# Patient Record
Sex: Male | Born: 1945 | Race: Black or African American | Hispanic: No
Health system: Southern US, Community
[De-identification: ages and names within clinical notes are randomized; demographics above are authoritative.]

## PROBLEM LIST (undated history)

## (undated) DIAGNOSIS — C801 Malignant (primary) neoplasm, unspecified: Secondary | ICD-10-CM

## (undated) DIAGNOSIS — D491 Neoplasm of unspecified behavior of respiratory system: Secondary | ICD-10-CM

---

## 2003-02-20 ENCOUNTER — Emergency Department (HOSPITAL_COMMUNITY): Admission: EM | Admit: 2003-02-20 | Discharge: 2003-02-20 | Payer: Self-pay | Admitting: Emergency Medicine

## 2003-02-23 ENCOUNTER — Emergency Department (HOSPITAL_COMMUNITY): Admission: EM | Admit: 2003-02-23 | Discharge: 2003-02-23 | Payer: Self-pay | Admitting: Emergency Medicine

## 2010-01-12 ENCOUNTER — Inpatient Hospital Stay (HOSPITAL_COMMUNITY)
Admission: EM | Admit: 2010-01-12 | Discharge: 2010-01-15 | Payer: Self-pay | Source: Home / Self Care | Attending: Internal Medicine | Admitting: Internal Medicine

## 2010-01-13 ENCOUNTER — Encounter (INDEPENDENT_AMBULATORY_CARE_PROVIDER_SITE_OTHER): Payer: Self-pay | Admitting: Internal Medicine

## 2010-04-04 LAB — URINALYSIS, ROUTINE W REFLEX MICROSCOPIC
Bilirubin Urine: NEGATIVE
Bilirubin Urine: NEGATIVE
Ketones, ur: NEGATIVE mg/dL
Ketones, ur: NEGATIVE mg/dL
Nitrite: NEGATIVE
Nitrite: NEGATIVE
Specific Gravity, Urine: 1.013 (ref 1.005–1.030)
Urobilinogen, UA: 1 mg/dL (ref 0.0–1.0)
Urobilinogen, UA: 1 mg/dL (ref 0.0–1.0)
pH: 6.5 (ref 5.0–8.0)

## 2010-04-04 LAB — URINE CULTURE
Colony Count: 35000
Culture  Setup Time: 201112211430
Culture  Setup Time: 201112232024
Culture: NO GROWTH

## 2010-04-04 LAB — DIFFERENTIAL
Basophils Absolute: 0 10*3/uL (ref 0.0–0.1)
Basophils Relative: 0 % (ref 0–1)
Basophils Relative: 0 % (ref 0–1)
Eosinophils Absolute: 0 10*3/uL (ref 0.0–0.7)
Eosinophils Absolute: 0.1 10*3/uL (ref 0.0–0.7)
Eosinophils Relative: 0 % (ref 0–5)
Lymphs Abs: 1.5 10*3/uL (ref 0.7–4.0)
Monocytes Absolute: 1.2 10*3/uL — ABNORMAL HIGH (ref 0.1–1.0)
Monocytes Absolute: 1.2 10*3/uL — ABNORMAL HIGH (ref 0.1–1.0)
Monocytes Relative: 11 % (ref 3–12)

## 2010-04-04 LAB — COMPREHENSIVE METABOLIC PANEL
ALT: 34 U/L (ref 0–53)
ALT: 60 U/L — ABNORMAL HIGH (ref 0–53)
AST: 39 U/L — ABNORMAL HIGH (ref 0–37)
Albumin: 2.4 g/dL — ABNORMAL LOW (ref 3.5–5.2)
Albumin: 2.4 g/dL — ABNORMAL LOW (ref 3.5–5.2)
Alkaline Phosphatase: 56 U/L (ref 39–117)
Alkaline Phosphatase: 65 U/L (ref 39–117)
BUN: 13 mg/dL (ref 6–23)
BUN: 19 mg/dL (ref 6–23)
CO2: 26 mEq/L (ref 19–32)
Chloride: 106 mEq/L (ref 96–112)
Chloride: 107 mEq/L (ref 96–112)
Creatinine, Ser: 1.19 mg/dL (ref 0.4–1.5)
Glucose, Bld: 100 mg/dL — ABNORMAL HIGH (ref 70–99)
Potassium: 4.1 mEq/L (ref 3.5–5.1)
Sodium: 137 mEq/L (ref 135–145)
Total Bilirubin: 0.6 mg/dL (ref 0.3–1.2)
Total Protein: 6.1 g/dL (ref 6.0–8.3)

## 2010-04-04 LAB — CBC
HCT: 31.9 % — ABNORMAL LOW (ref 39.0–52.0)
Hemoglobin: 10.6 g/dL — ABNORMAL LOW (ref 13.0–17.0)
MCH: 31.5 pg (ref 26.0–34.0)
MCHC: 33.1 g/dL (ref 30.0–36.0)
MCV: 94.3 fL (ref 78.0–100.0)
MCV: 94.4 fL (ref 78.0–100.0)
MCV: 94.7 fL (ref 78.0–100.0)
Platelets: 143 10*3/uL — ABNORMAL LOW (ref 150–400)
Platelets: 146 10*3/uL — ABNORMAL LOW (ref 150–400)
RBC: 3.37 MIL/uL — ABNORMAL LOW (ref 4.22–5.81)
RBC: 3.57 MIL/uL — ABNORMAL LOW (ref 4.22–5.81)
RDW: 14.1 % (ref 11.5–15.5)
WBC: 10.1 10*3/uL (ref 4.0–10.5)
WBC: 14 10*3/uL — ABNORMAL HIGH (ref 4.0–10.5)

## 2010-04-04 LAB — BASIC METABOLIC PANEL
BUN: 21 mg/dL (ref 6–23)
Calcium: 8 mg/dL — ABNORMAL LOW (ref 8.4–10.5)
GFR calc non Af Amer: 53 mL/min — ABNORMAL LOW (ref >60)
Potassium: 3.7 mEq/L (ref 3.5–5.1)
Sodium: 139 mEq/L (ref 135–145)

## 2010-04-04 LAB — CULTURE, BLOOD (ROUTINE X 2)
Culture  Setup Time: 201112212211
Culture: NO GROWTH
Culture: NO GROWTH

## 2010-04-04 LAB — RAPID URINE DRUG SCREEN, HOSP PERFORMED
Amphetamines: NOT DETECTED
Barbiturates: NOT DETECTED

## 2010-04-04 LAB — URINE MICROSCOPIC-ADD ON

## 2010-04-04 LAB — CARDIAC PANEL(CRET KIN+CKTOT+MB+TROPI)
CK, MB: 0.7 ng/mL (ref 0.3–4.0)
Relative Index: INVALID (ref 0.0–2.5)
Total CK: 82 U/L (ref 7–232)
Troponin I: 0.02 ng/mL (ref 0.00–0.06)

## 2010-04-04 LAB — MRSA PCR SCREENING: MRSA by PCR: NEGATIVE

## 2010-04-04 LAB — PROCALCITONIN: Procalcitonin: 20.76 ng/mL

## 2018-08-03 ENCOUNTER — Inpatient Hospital Stay (HOSPITAL_COMMUNITY)
Admission: EM | Admit: 2018-08-03 | Discharge: 2018-08-07 | DRG: 640 | Disposition: A | Discharge status: Hospice | Payer: Medicare Other | Source: Skilled Nursing Facility | Attending: Internal Medicine | Admitting: Internal Medicine

## 2018-08-03 ENCOUNTER — Inpatient Hospital Stay (HOSPITAL_COMMUNITY): Payer: Medicare Other

## 2018-08-03 ENCOUNTER — Emergency Department (HOSPITAL_COMMUNITY): Payer: Medicare Other

## 2018-08-03 ENCOUNTER — Encounter (HOSPITAL_COMMUNITY): Payer: Self-pay | Admitting: Emergency Medicine

## 2018-08-03 ENCOUNTER — Other Ambulatory Visit: Payer: Self-pay

## 2018-08-03 DIAGNOSIS — Z1159 Encounter for screening for other viral diseases: Secondary | ICD-10-CM | POA: Diagnosis not present

## 2018-08-03 DIAGNOSIS — Z515 Encounter for palliative care: Secondary | ICD-10-CM | POA: Diagnosis not present

## 2018-08-03 DIAGNOSIS — Z79899 Other long term (current) drug therapy: Secondary | ICD-10-CM

## 2018-08-03 DIAGNOSIS — E870 Hyperosmolality and hypernatremia: Secondary | ICD-10-CM | POA: Diagnosis present

## 2018-08-03 DIAGNOSIS — C7931 Secondary malignant neoplasm of brain: Secondary | ICD-10-CM | POA: Diagnosis present

## 2018-08-03 DIAGNOSIS — Z7189 Other specified counseling: Secondary | ICD-10-CM

## 2018-08-03 DIAGNOSIS — G9341 Metabolic encephalopathy: Secondary | ICD-10-CM | POA: Diagnosis present

## 2018-08-03 DIAGNOSIS — Z7951 Long term (current) use of inhaled steroids: Secondary | ICD-10-CM

## 2018-08-03 DIAGNOSIS — E46 Unspecified protein-calorie malnutrition: Secondary | ICD-10-CM | POA: Diagnosis present

## 2018-08-03 DIAGNOSIS — G9340 Encephalopathy, unspecified: Secondary | ICD-10-CM | POA: Diagnosis not present

## 2018-08-03 DIAGNOSIS — E876 Hypokalemia: Secondary | ICD-10-CM | POA: Diagnosis present

## 2018-08-03 DIAGNOSIS — R296 Repeated falls: Secondary | ICD-10-CM | POA: Diagnosis present

## 2018-08-03 DIAGNOSIS — Z9221 Personal history of antineoplastic chemotherapy: Secondary | ICD-10-CM

## 2018-08-03 DIAGNOSIS — D649 Anemia, unspecified: Secondary | ICD-10-CM | POA: Diagnosis not present

## 2018-08-03 DIAGNOSIS — E861 Hypovolemia: Secondary | ICD-10-CM | POA: Diagnosis present

## 2018-08-03 DIAGNOSIS — R4182 Altered mental status, unspecified: Secondary | ICD-10-CM | POA: Diagnosis not present

## 2018-08-03 DIAGNOSIS — C3490 Malignant neoplasm of unspecified part of unspecified bronchus or lung: Secondary | ICD-10-CM | POA: Diagnosis present

## 2018-08-03 DIAGNOSIS — Z66 Do not resuscitate: Secondary | ICD-10-CM | POA: Diagnosis present

## 2018-08-03 DIAGNOSIS — Z7982 Long term (current) use of aspirin: Secondary | ICD-10-CM

## 2018-08-03 DIAGNOSIS — E860 Dehydration: Secondary | ICD-10-CM

## 2018-08-03 HISTORY — DX: Neoplasm of unspecified behavior of respiratory system: D49.1

## 2018-08-03 HISTORY — DX: Malignant (primary) neoplasm, unspecified: C80.1

## 2018-08-03 LAB — RENAL FUNCTION PANEL
Albumin: 1.9 g/dL — ABNORMAL LOW (ref 3.5–5.0)
Anion gap: 8 (ref 5–15)
BUN: 31 mg/dL — ABNORMAL HIGH (ref 8–23)
CO2: 20 mmol/L — ABNORMAL LOW (ref 22–32)
Calcium: 12.2 mg/dL — ABNORMAL HIGH (ref 8.9–10.3)
Chloride: 126 mmol/L — ABNORMAL HIGH (ref 98–111)
Creatinine, Ser: 0.97 mg/dL (ref 0.61–1.24)
GFR calc Af Amer: >60 mL/min (ref >60)
GFR calc non Af Amer: >60 mL/min (ref >60)
Glucose, Bld: 97 mg/dL (ref 70–99)
Phosphorus: 1.9 mg/dL — ABNORMAL LOW (ref 2.5–4.6)
Potassium: 3.2 mmol/L — ABNORMAL LOW (ref 3.5–5.1)
Sodium: 154 mmol/L — ABNORMAL HIGH (ref 135–145)

## 2018-08-03 LAB — CBC
HCT: 32.1 % — ABNORMAL LOW (ref 39.0–52.0)
Hemoglobin: 8.8 g/dL — ABNORMAL LOW (ref 13.0–17.0)
MCH: 29.7 pg (ref 26.0–34.0)
MCHC: 27.4 g/dL — ABNORMAL LOW (ref 30.0–36.0)
MCV: 108.4 fL — ABNORMAL HIGH (ref 80.0–100.0)
Platelets: 156 10*3/uL (ref 150–400)
RBC: 2.96 MIL/uL — ABNORMAL LOW (ref 4.22–5.81)
RDW: 20.5 % — ABNORMAL HIGH (ref 11.5–15.5)
WBC: 6.7 10*3/uL (ref 4.0–10.5)
nRBC: 0 % (ref 0.0–0.2)

## 2018-08-03 LAB — CBC WITH DIFFERENTIAL/PLATELET
Abs Immature Granulocytes: 0.02 10*3/uL (ref 0.00–0.07)
Basophils Absolute: 0 10*3/uL (ref 0.0–0.1)
Basophils Relative: 0 %
Eosinophils Absolute: 0 10*3/uL (ref 0.0–0.5)
Eosinophils Relative: 0 %
HCT: 35.1 % — ABNORMAL LOW (ref 39.0–52.0)
Hemoglobin: 9.8 g/dL — ABNORMAL LOW (ref 13.0–17.0)
Immature Granulocytes: 0 %
Lymphocytes Relative: 4 %
Lymphs Abs: 0.3 10*3/uL — ABNORMAL LOW (ref 0.7–4.0)
MCH: 29.5 pg (ref 26.0–34.0)
MCHC: 27.9 g/dL — ABNORMAL LOW (ref 30.0–36.0)
MCV: 105.7 fL — ABNORMAL HIGH (ref 80.0–100.0)
Monocytes Absolute: 0.4 10*3/uL (ref 0.1–1.0)
Monocytes Relative: 6 %
Neutro Abs: 5.9 10*3/uL (ref 1.7–7.7)
Neutrophils Relative %: 90 %
Platelets: 194 10*3/uL (ref 150–400)
RBC: 3.32 MIL/uL — ABNORMAL LOW (ref 4.22–5.81)
RDW: 20.2 % — ABNORMAL HIGH (ref 11.5–15.5)
WBC: 6.6 10*3/uL (ref 4.0–10.5)
nRBC: 0 % (ref 0.0–0.2)

## 2018-08-03 LAB — COMPREHENSIVE METABOLIC PANEL
ALT: 19 U/L (ref 0–44)
AST: 21 U/L (ref 15–41)
Albumin: 1.9 g/dL — ABNORMAL LOW (ref 3.5–5.0)
Alkaline Phosphatase: 110 U/L (ref 38–126)
Anion gap: 9 (ref 5–15)
BUN: 32 mg/dL — ABNORMAL HIGH (ref 8–23)
CO2: 21 mmol/L — ABNORMAL LOW (ref 22–32)
Calcium: 12.6 mg/dL — ABNORMAL HIGH (ref 8.9–10.3)
Chloride: 123 mmol/L — ABNORMAL HIGH (ref 98–111)
Creatinine, Ser: 1 mg/dL (ref 0.61–1.24)
GFR calc Af Amer: >60 mL/min (ref >60)
GFR calc non Af Amer: >60 mL/min (ref >60)
Glucose, Bld: 113 mg/dL — ABNORMAL HIGH (ref 70–99)
Potassium: 3.3 mmol/L — ABNORMAL LOW (ref 3.5–5.1)
Sodium: 153 mmol/L — ABNORMAL HIGH (ref 135–145)
Total Bilirubin: 0.8 mg/dL (ref 0.3–1.2)
Total Protein: 6 g/dL — ABNORMAL LOW (ref 6.5–8.1)

## 2018-08-03 LAB — BASIC METABOLIC PANEL
Anion gap: 13 (ref 5–15)
BUN: 37 mg/dL — ABNORMAL HIGH (ref 8–23)
CO2: 22 mmol/L (ref 22–32)
Calcium: 13.4 mg/dL (ref 8.9–10.3)
Chloride: 119 mmol/L — ABNORMAL HIGH (ref 98–111)
Creatinine, Ser: 1.32 mg/dL — ABNORMAL HIGH (ref 0.61–1.24)
GFR calc Af Amer: >60 mL/min (ref >60)
GFR calc non Af Amer: 53 mL/min — ABNORMAL LOW (ref >60)
Glucose, Bld: 117 mg/dL — ABNORMAL HIGH (ref 70–99)
Potassium: 2.7 mmol/L — CL (ref 3.5–5.1)
Sodium: 154 mmol/L — ABNORMAL HIGH (ref 135–145)

## 2018-08-03 LAB — TSH: TSH: 2.312 u[IU]/mL (ref 0.350–4.500)

## 2018-08-03 LAB — SARS CORONAVIRUS 2 BY RT PCR (HOSPITAL ORDER, PERFORMED IN ~~LOC~~ HOSPITAL LAB): SARS Coronavirus 2: NEGATIVE

## 2018-08-03 LAB — LACTIC ACID, PLASMA
Lactic Acid, Venous: 3.6 mmol/L (ref 0.5–1.9)
Lactic Acid, Venous: 2.3 mmol/L (ref 0.5–1.9)

## 2018-08-03 LAB — MAGNESIUM: Magnesium: 2.1 mg/dL (ref 1.7–2.4)

## 2018-08-03 MED ORDER — SODIUM CHLORIDE 0.9 % IV BOLUS
1000.0000 mL | Freq: Once | INTRAVENOUS | Status: AC
Start: 2018-08-03 — End: 2018-08-03
  Administered 2018-08-03: 1000 mL via INTRAVENOUS

## 2018-08-03 MED ORDER — HEPARIN SODIUM (PORCINE) 5000 UNIT/ML IJ SOLN
5000.0000 [IU] | Freq: Three times a day (TID) | INTRAMUSCULAR | Status: DC
  Administered 2018-08-03 – 2018-08-06 (×8): 5000 [IU] via SUBCUTANEOUS
  Filled 2018-08-03 (×8): qty 1

## 2018-08-03 MED ORDER — ACETAMINOPHEN 650 MG RE SUPP
650.0000 mg | Freq: Four times a day (QID) | RECTAL | Status: DC | PRN
  Administered 2018-08-05 – 2018-08-06 (×2): 650 mg via RECTAL
  Filled 2018-08-03 (×2): qty 1

## 2018-08-03 MED ORDER — SODIUM CHLORIDE 0.9% FLUSH
3.0000 mL | Freq: Two times a day (BID) | INTRAVENOUS | Status: DC
  Administered 2018-08-03 – 2018-08-07 (×4): 3 mL via INTRAVENOUS

## 2018-08-03 MED ORDER — LACTATED RINGERS IV BOLUS
1000.0000 mL | Freq: Once | INTRAVENOUS | Status: AC
  Administered 2018-08-03: 1000 mL via INTRAVENOUS

## 2018-08-03 MED ORDER — ACETAMINOPHEN 325 MG PO TABS
650.0000 mg | ORAL_TABLET | Freq: Four times a day (QID) | ORAL | Status: DC | PRN
  Filled 2018-08-03: qty 2

## 2018-08-03 MED ORDER — ZOLEDRONIC ACID 4 MG/5ML IV CONC
4.0000 mg | Freq: Once | INTRAVENOUS | Status: AC
  Administered 2018-08-03: 4 mg via INTRAVENOUS
  Filled 2018-08-03: qty 5

## 2018-08-03 MED ORDER — POTASSIUM CHLORIDE 10 MEQ/100ML IV SOLN
10.0000 meq | INTRAVENOUS | Status: AC
  Administered 2018-08-03 (×4): 10 meq via INTRAVENOUS
  Filled 2018-08-03 (×4): qty 100

## 2018-08-03 MED ORDER — CALCITONIN (SALMON) 200 UNIT/ML IJ SOLN
4.0000 [IU]/kg | Freq: Two times a day (BID) | INTRAMUSCULAR | Status: DC
  Administered 2018-08-03 – 2018-08-04 (×3): 392 [IU] via INTRAMUSCULAR
  Filled 2018-08-03 (×5): qty 1.96

## 2018-08-03 MED ORDER — POTASSIUM CHLORIDE 2 MEQ/ML IV SOLN
INTRAVENOUS | Status: DC
  Administered 2018-08-03 – 2018-08-04 (×2): via INTRAVENOUS
  Filled 2018-08-03 (×2): qty 1000

## 2018-08-03 MED ORDER — LORAZEPAM 2 MG/ML IJ SOLN
INTRAMUSCULAR | Status: AC
  Administered 2018-08-03: 2 mg
  Filled 2018-08-03: qty 1

## 2018-08-03 MED ORDER — SODIUM CHLORIDE 0.9 % IV BOLUS
1000.0000 mL | Freq: Once | INTRAVENOUS | Status: AC
  Administered 2018-08-03: 1000 mL via INTRAVENOUS

## 2018-08-03 NOTE — ED Notes (Signed)
Help get patient undress on the monitor patient is resting with nurse at bedside and call bell in reach

## 2018-08-03 NOTE — ED Triage Notes (Addendum)
Per EMS- Pt here from [facility]. per staff, pt had an unwitnessed fall this morning and has been altered since then. Pt making incomprehensible speech. Pt has developmental delay at baseline, unsure his normal speech. Pt was found on the floor, C collar in place. 18 GPIV to AC. 500 bolus given. Hr 120. Bp 118/72.   *Spoke to nurse from [place]. Nurse reports pt had a fall, was found on the ground. Sat  Up and was talking. Reports later after 730am pt was found with seizure like activity with gaze to one side with rapid eye movement and twitching. Pt has been non verbal since.

## 2018-08-03 NOTE — H&P (Addendum)
Date: 08/03/2018               Patient Name:  Bryan Baxter MRN: [id number]  DOB: 03/16/1945 Age / Sex: 73 y.o., male   PCP: Francesca Oman, DO         Medical Service: Internal Medicine Teaching Service         Attending Physician: Dr. Virgel Manifold, MD    First Contact: Dr. Sheppard Coil Pager: [phone]  Second Contact: Dr. Trilby Drummer Pager: [phone]       After Hours (After 5p/  First Contact Pager: [phone]  weekends / holidays): Second Contact Pager: [phone]   Chief Complaint: Confusion/ AMS  History of Present Illness: History obtained by chart review and is severely limited by patient's altered mental status.    Bryan Baxter is a 73 year old male with a hx of small cell lung cancer, multiple falls who presents with acute change in his mental status. Reportedly, he had a seizure at the nursing home, [facility].  He allegedly had a deviated gaze and was unresponsive in addition to having generalized shaking. Although the patient currently lives at assisted living, he used to live on his own as recent as 3 months ago. At that time per notes in his chart, his speech was described as fluent and he was able to cook for himself.    Spoke with the daughter on the phone who was updated on his current condition. She confirmed that the chemotherapy he was receiving was primarily for comfort as opposed to treatment. She says he is DNR in case of code.  Of note the patient receives all of his care at [facility].  Records are available in care everywhere.  Meds:  Current Meds  Medication Sig   albuterol (VENTOLIN HFA) 108 (90 Base) MCG/ACT inhaler Inhale 1-2 puffs into the lungs every 6 (six) hours as needed for wheezing or shortness of breath.   aluminum-magnesium hydroxide 200-200 MG/5ML suspension Take 30 mLs by mouth every 4 (four) hours as needed for indigestion.   amitriptyline (ELAVIL) 10 MG tablet Take 10 mg by mouth at bedtime.   aspirin EC 81 MG tablet  Take 81 mg by mouth daily.   atorvastatin (LIPITOR) 40 MG tablet Take 40 mg by mouth daily.   benzonatate (TESSALON) 200 MG capsule Take 200 mg by mouth 3 (three) times daily as needed for cough.   bisacodyl (DULCOLAX) 5 MG EC tablet Take 10 mg by mouth daily as needed for moderate constipation.   guaiFENesin (MUCINEX) 600 MG 12 hr tablet Take 600 mg by mouth 2 (two) times daily.   megestrol (MEGACE) 400 MG/10ML suspension Take 400 mg by mouth daily.   metoprolol succinate (TOPROL-XL) 50 MG 24 hr tablet Take 50 mg by mouth daily. Take with or immediately following a meal.   mirtazapine (REMERON) 15 MG tablet Take 7.5 mg by mouth at bedtime.   oxybutynin (DITROPAN-XL) 5 MG 24 hr tablet Take 5 mg by mouth at bedtime.   tamsulosin (FLOMAX) 0.4 MG CAPS capsule Take 0.4 mg by mouth daily.    Allergies: Allergies as of 08/03/2018   (No Known Allergies)   Past Medical History:  Diagnosis Date   Neoplasm of lung    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HCC)    Family History:  None on file  Social History:  Not on file  Review of Systems: A complete ROS was negative except as per HPI.   Physical Exam: Blood pressure 125/83,  pulse (!) 131, temperature (!) 97.2 F (36.2 C), temperature source Rectal, resp. rate 18, weight 97.8 kg, SpO2 94 %.  Physical Exam Constitutional:      Appearance: He is ill-appearing and toxic-appearing.     Comments: Not alert, cachectic, malnourished appearing  HENT:     Head: Normocephalic and atraumatic.     Comments: Temporal wasting      Mouth/Throat:     Mouth: Mucous membranes are dry.     Comments: Thick saliva Eyes:     General: No scleral icterus.       Right eye: No discharge.        Left eye: No discharge.     Pupils: Pupils are equal, round, and reactive to light.     Comments: Pinpoint pupils bilaterally  Difficult to appreciate pupillary response due to pinpoint size, but pupils of PERRL  Cardiovascular:     Rate and Rhythm: Tachycardia present.      Pulses: Normal pulses.     Heart sounds: Normal heart sounds. No murmur. No friction rub. No gallop.   Pulmonary:     Effort: Pulmonary effort is normal. No respiratory distress.     Breath sounds: Normal breath sounds. No wheezing.    EKG: personally reviewed my interpretation is tachycardia.   CXR: personally reviewed my interpretation is unremarkable. No consolidations or acute processes appreciated. IMPRESSION: Mild scarring right mid lung. No edema or consolidation. Stable cardiac silhouette. Stable central catheter positioning.  CT Cervical Spine w/o Contrast: IMPRESSION: 1. No acute intracranial abnormality. 2. Moderate brain parenchymal atrophy, chronic microvascular disease. 3. 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 4. Lytic lesions within the skull overlying the left frontal lobe, and left cerebellum, concerning for osseous metastatic disease or primary bone malignancy. 5. No evidence of acute traumatic injury to the cervical spine. 6. Lytic lesions within C4, C2 and C1 vertebral bodies, concerning for osseous metastatic disease. No evidence of pathologic fractures. 7. Multilevel osteoarthritic changes of the cervical spine. 8. Nodular pleural/subpleural thickening in the right apex, which may represent pleural-parenchymal scarring, however a pulmonary nodule cannot be excluded. 9. Calcific atherosclerotic disease of the carotid arteries.  CT Head w/o Contrast IMPRESSION: 1. No acute intracranial abnormality. 2. Moderate brain parenchymal atrophy, chronic microvascular disease. 3. 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 4. Lytic lesions within the skull overlying the left frontal lobe, and left cerebellum, concerning for osseous metastatic disease or primary bone malignancy. 5. No evidence of acute traumatic injury to the cervical spine. 6. Lytic lesions within C4, C2 and C1 vertebral bodies, concerning for osseous metastatic disease.  No evidence of pathologic fractures. 7. Multilevel osteoarthritic changes of the cervical spine. 8. Nodular pleural/subpleural thickening in the right apex, which may represent pleural-parenchymal scarring, however a pulmonary nodule cannot be excluded. 9. Calcific atherosclerotic disease of the carotid arteries.  Assessment & Plan by Problem: Principal Problem:   Hypercalcemia Active Problems:   Acute encephalopathy  In summary Mr. Hanners is a 74 year old male with a hx of small cell lung cancer with diffuse lytic lesions, multiple recent falls who presents with acute change in his mental status.  Per chart review, the patient was cooking for himself and mobile with a cane a couple months ago.  This is a large an acute change from his baseline.  #Altered Mental Status #Hypernatremia #Hypokalemia #Hypercalcemia #Seizure? #FEN/GI: Reportedly had a possible seizure at [facility] place, however we are unable to confirm this.  He was described to have a  deviated gaze, was unresponsive and having generalized shaking.  He had no seizure-like activity in the ED, however he was agitated and resisting staff.  He received 2mg  Ativan for agitation.  On our exam he was unresponsive, but withdrew to painful stimuli in all 4 extremities.  Despite our inability to confirm seizure-like activity, it is highly suspicious given severe electrolyte imbalances. It certainly plausible that he had a seizure because of this. He is also hypovolemic as evidenced by dry appearance on physical exam and absent urine output on cath attempt per nursing.- We will plan to correct his electrolyte abnormalities with IV fluids.  Received 2 L of normal saline and LR bolus x1 in the ED will continue to give fluids as necessary.   - Calcitonin injection twice daily, Zomet 4mg  IV - IV KCl - NPO - EEG to evaluate for ongoing seizures - Holding home oxybutynin, tamsulosin, albuterol, atorvastatin, amitriptyline, baby aspirin Megace,  metoprolol, sucralfate, Mag-Ox  #Small cell lung cancer: Likely the cause of his electrolyte imbalances.  Lytic lesions are present diffusely in his skull and spine as evidenced on the CT of the spine and head. Currently on palliative radiotherapy per [facility] hematology. - Will ensure f/u upon discharge.  Current Meds  Medication Sig   albuterol (VENTOLIN HFA) 108 (90 Base) MCG/ACT inhaler Inhale 1-2 puffs into the lungs every 6 (six) hours as needed for wheezing or shortness of breath.   aluminum-magnesium hydroxide 200-200 MG/5ML suspension Take 30 mLs by mouth every 4 (four) hours as needed for indigestion.   amitriptyline (ELAVIL) 10 MG tablet Take 10 mg by mouth at bedtime.   aspirin EC 81 MG tablet Take 81 mg by mouth daily.   atorvastatin (LIPITOR) 40 MG tablet Take 40 mg by mouth daily.   benzonatate (TESSALON) 200 MG capsule Take 200 mg by mouth 3 (three) times daily as needed for cough.   bisacodyl (DULCOLAX) 5 MG EC tablet Take 10 mg by mouth daily as needed for moderate constipation.   guaiFENesin (MUCINEX) 600 MG 12 hr tablet Take 600 mg by mouth 2 (two) times daily.   megestrol (MEGACE) 400 MG/10ML suspension Take 400 mg by mouth daily.   metoprolol succinate (TOPROL-XL) 50 MG 24 hr tablet Take 50 mg by mouth daily. Take with or immediately following a meal.   mirtazapine (REMERON) 15 MG tablet Take 7.5 mg by mouth at bedtime.   oxybutynin (DITROPAN-XL) 5 MG 24 hr tablet Take 5 mg by mouth at bedtime.   tamsulosin (FLOMAX) 0.4 MG CAPS capsule Take 0.4 mg by mouth daily.    Dispo: Admit patient to Observation with expected length of stay less than 2 midnights.  Signed: Earlene Plater, MD Internal Medicine, PGY1 Pager: [phone]  08/03/2018,2:09 PM

## 2018-08-03 NOTE — Progress Notes (Signed)
Spoke with patient's daughter Carlyon Shadow about patient status and code status. She was informed that patient is currently unresponsive and we are concerned that this is due to his electrolyte abnormalities, namely very high calcium.   We discussed code status and she states she had not discussed this with her father. We talked about what would be involved in the case of an emergency: CPR, Electric shock, medications, and likely intubation. She stated she would discuss this with her sister.   I spoke with Mrs Reather Laurence again who stated she had spoken with her sister and they had agreed to make their father DNR. Given his current health and history of lung cancer undergoing palliative chemo therapy.  We discussed current visitation limitations due to COVID-19, but discussed a possible exception due to his current status. She stated that neither her nor her sister lives in the area, but the patient's sister Mervyn Gay) does. Will work to get her an exception when patient gets to the floor  Contact information is below:  Celesta Gentile [phone] [facility]) Daughter Lowanda Foster [phone] [facility]) Daughter Mervyn Gay [phone] Sister, Will need clearance to visit  Pearson Grippe, DO IM PGY-3

## 2018-08-03 NOTE — Progress Notes (Signed)
Bryan Baxter [id number] Admission Data: 08/03/2018 6:45 PM Attending Provider: Oda Kilts, MD  EPP:IRJJOA, [name], DO Consults/ Treatment Team:   Bryan Baxter is a 73 y.o. male patient admitted from ED awake, alert  & orientated  X 3,  DNR, VSS - Blood pressure (!) 158/105, pulse (!) 124, temperature (!) 97.1 F (36.2 C), temperature source Axillary, resp. rate 14, weight 97.8 kg, SpO2 98 %.,no c/o shortness of breath, no c/o chest pain, no distress noted. Tele # [id number] placed and pt is currently running:sinus tachycardia.   IV site WDL:  antecubital right, condition patent and no redness and left, condition patent and no redness with a transparent dsg that's clean dry and intact.  Allergies:  No Known Allergies   Past Medical History:  Diagnosis Date  . Neoplasm of lung   . 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[place])     History:  obtained from chart review. Tobacco/alcohol: unknown tobacco use history unreliable  Pt orientation to unit, room and routine. Information packet given to patient/family and safety video watched.  Admission INP armband ID verified with patient/family, and in place. SR up x 2, fall risk assessment complete with Patient and family verbalizing understanding of risks associated with falls. Pt verbalizes an understanding of how to use the call bell and to call for help before getting out of bed.  Skin, clean-dry- intact without evidence of bruising, or skin tears.   No evidence of skin break down noted on exam. Skin dry without any ecchymosis and skin tears and no pressure injuries noted. Suction set up at bedside, patient placed on low bed. Sister will be allowed to visit as patient will be followed by palliative care. Will cont to monitor and assist as needed. Aspiration Precautions  Order Aspiration precautions in EMR Recommend SLP consult  Elevate HOB 30 degrees or higher Sit upright 90 degrees for meals/snacks  Maintain suction set up in room Perform  oral care 4 times a day Inspect oral cavity for retained food or medications  Supervise or assist with meals as necessary based on assessment        Independent        Set up assist        Full supervision (feeder)     Jocelyn Lamer L. Tamala Julian, MSN, RN, [place], [place]  08/03/2018 6:45 PM

## 2018-08-03 NOTE — ED Notes (Signed)
Attempted report 

## 2018-08-03 NOTE — ED Notes (Signed)
In and out cath performed, no urine return noted. Md informed.

## 2018-08-03 NOTE — ED Notes (Signed)
Update given to emergency contact [place]. Attempted to update daughters darlene and cynthia but sent straight to voicemail.

## 2018-08-03 NOTE — ED Notes (Signed)
ED TO INPATIENT HANDOFF REPORT  ED Nurse Name and Phone #:   S Name/Age/Gender Bryan Baxter 73 y.o. male Room/Bed: TRACC/TRACC  Code Status   Code Status: Full Code  Home/SNF/Other Nursing Home  Triage Complete: Triage complete  Chief Complaint ALTERED  Triage Note Per EMS- Pt here from [facility] [place] [facility]. per staff, pt had an unwitnessed fall this morning and has been altered since then. Pt making incomprehensible speech. Pt has developmental delay at baseline, unsure his normal speech. Pt was found on the floor, C collar in place. 18 GPIV to AC. 500 bolus given. Hr 120. Bp 118/72.    Allergies No Known Allergies  Level of Care/Admitting Diagnosis ED Disposition    ED Disposition Condition [facility]: [place] [[id number]]  Level of Care: Progressive [102]  Covid Evaluation: Asymptomatic Screening Protocol (No Symptoms)  Diagnosis: Hypercalcemia [275.42.ICD-9-CM]  Admitting Physician: Oda Kilts [[id number]]  Attending Physician: Oda Kilts [[id number]]  Estimated length of stay: past midnight tomorrow  Certification:: I certify this patient will need inpatient services for at least 2 midnights  PT Class (Do Not Modify): Inpatient [101]  PT Acc Code (Do Not Modify): Private [1]       B Medical/Surgery History Past Medical History:  Diagnosis Date  . Neoplasm of lung   . 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[place])       A IV Location/Drains/Wounds Patient Lines/Drains/Airways Status   Active Line/Drains/Airways    Name:   Placement date:   Placement time:   Site:   Days:   Peripheral IV 08/03/18 Left Antecubital   08/03/18    0938    Antecubital   less than 1   Peripheral IV 08/03/18 Right Antecubital   08/03/18    0939    Antecubital   less than 1   External Urinary Catheter   08/03/18    -    -   less than 1          Intake/Output Last 24 hours  Intake/Output Summary (Last 24 hours) at 08/03/2018 1526 Last  data filed at 08/03/2018 1438 Gross per 24 hour  Intake 2100 ml  Output -  Net 2100 ml    Labs/Imaging Results for orders placed or performed during the hospital encounter of 08/03/18 (from the past 48 hour(s))  CBC with Differential     Status: Abnormal   Collection Time: 08/03/18  9:25 AM  Result Value Ref Range   WBC 6.6 4.0 - 10.5 K/uL   RBC 3.32 (L) 4.22 - 5.81 MIL/uL   Hemoglobin 9.8 (L) 13.0 - 17.0 g/dL   HCT 35.1 (L) 39.0 - 52.0 %   MCV 105.7 (H) 80.0 - 100.0 fL   MCH 29.5 26.0 - 34.0 pg   MCHC 27.9 (L) 30.0 - 36.0 g/dL   RDW 20.2 (H) 11.5 - 15.5 %   Platelets 194 150 - 400 K/uL   nRBC 0.0 0.0 - 0.2 %   Neutrophils Relative % 90 %   Neutro Abs 5.9 1.7 - 7.7 K/uL   Lymphocytes Relative 4 %   Lymphs Abs 0.3 (L) 0.7 - 4.0 K/uL   Monocytes Relative 6 %   Monocytes Absolute 0.4 0.1 - 1.0 K/uL   Eosinophils Relative 0 %   Eosinophils Absolute 0.0 0.0 - 0.5 K/uL   Basophils Relative 0 %   Basophils Absolute 0.0 0.0 - 0.1 K/uL   RBC Morphology MORPHOLOGY UNREMARKABLE    Immature Granulocytes  0 %   Abs Immature Granulocytes 0.02 0.00 - 0.07 K/uL    Comment: Performed at [facility], [place] [address], [place], [place] [address]  Basic metabolic panel     Status: Abnormal   Collection Time: 08/03/18  9:25 AM  Result Value Ref Range   Sodium 154 (H) 135 - 145 mmol/L   Potassium 2.7 (LL) 3.5 - 5.1 mmol/L    Comment: CRITICAL RESULT CALLED TO, READ BACK BY AND VERIFIED WITH: A.Lonita Debes,RN @ 1017 08/03/2018 WEBBERJ    Chloride 119 (H) 98 - 111 mmol/L   CO2 22 22 - 32 mmol/L   Glucose, Bld 117 (H) 70 - 99 mg/dL   BUN 37 (H) 8 - 23 mg/dL   Creatinine, Ser 1.32 (H) 0.61 - 1.24 mg/dL   Calcium 13.4 (HH) 8.9 - 10.3 mg/dL    Comment: CRITICAL RESULT CALLED TO, READ BACK BY AND VERIFIED WITH: A.Sreeja Spies,RN @ 1017 08/03/2018 WEBBERJ    GFR calc non Af Amer 53 (L) >60 mL/min   GFR calc Af Amer >60 >60 mL/min   Anion gap 13 5 - 15    Comment: Performed at [facility] [address], [place], [place] [address]  Lactic acid, plasma     Status: Abnormal   Collection Time: 08/03/18  9:25 AM  Result Value Ref Range   Lactic Acid, Venous 3.6 (HH) 0.5 - 1.9 mmol/L    Comment: CRITICAL RESULT CALLED TO, READ BACK BY AND VERIFIED WITH: A.Takenya Travaglini,RN @ 1009 08/03/2018 [place] Performed at [facility], [place] [address] [place], [place] [address]   Lactic acid, plasma     Status: Abnormal   Collection Time: 08/03/18 11:07 AM  Result Value Ref Range   Lactic Acid, Venous 2.3 (HH) 0.5 - 1.9 mmol/L    Comment: CRITICAL RESULT CALLED TO, READ BACK BY AND VERIFIED WITH: A.Kiari Hosmer,RN @ 0737 08/03/2018 [place] Performed at [facility], [place] [address], [place], [place] [address]   SARS Coronavirus 2 (CEPHEID - Performed in [facility] lab), Hosp Order     Status: None   Collection Time: 08/03/18  1:44 PM   Specimen: Nasopharyngeal Swab  Result Value Ref Range   SARS Coronavirus 2 NEGATIVE NEGATIVE    Comment: (NOTE) If result is NEGATIVE SARS-CoV-2 target nucleic acids are NOT DETECTED. The SARS-CoV-2 RNA is generally detectable in upper and lower  respiratory specimens during the acute phase of infection. The lowest  concentration of SARS-CoV-2 viral copies this assay can detect is 250  copies / mL. A negative result does not preclude SARS-CoV-2 infection  and should not be used as the sole basis for treatment or other  patient management decisions.  A negative result may occur with  improper specimen collection / handling, submission of specimen other  than nasopharyngeal swab, presence of viral mutation(s) within the  areas targeted by this assay, and inadequate number of viral copies  (<250 copies / mL). A negative result must be combined with clinical  observations, patient history, and epidemiological information. If result is POSITIVE SARS-CoV-2 target nucleic acids are DETECTED. The SARS-CoV-2 RNA is generally detectable in upper and  lower  respiratory specimens dur ing the acute phase of infection.  Positive  results are indicative of active infection with SARS-CoV-2.  Clinical  correlation with patient history and other diagnostic information is  necessary to determine patient infection status.  Positive results do  not rule out bacterial infection or co-infection  with other viruses. If result is PRESUMPTIVE POSTIVE SARS-CoV-2 nucleic acids MAY BE PRESENT.   A presumptive positive result was obtained on the submitted specimen  and confirmed on repeat testing.  While 2019 novel coronavirus  (SARS-CoV-2) nucleic acids may be present in the submitted sample  additional confirmatory testing may be necessary for epidemiological  and / or clinical management purposes  to differentiate between  SARS-CoV-2 and other Sarbecovirus currently known to infect humans.  If clinically indicated additional testing with an alternate test  methodology [phone]) is advised. The SARS-CoV-2 RNA is generally  detectable in upper and lower respiratory sp ecimens during the acute  phase of infection. The expected result is Negative. Fact Sheet for Patients:  StrictlyIdeas.no Fact Sheet for Healthcare Providers: BankingDealers.co.za This test is not yet approved or cleared by the [place] FDA and has been authorized for detection and/or diagnosis of SARS-CoV-2 by FDA under an Emergency Use Authorization (EUA).  This EUA will remain in effect (meaning this test can be used) for the duration of the COVID-19 declaration under Section 564(b)(1) of the Act, 21 U.S.C. section 360bbb-3(b)(1), unless the authorization is terminated or revoked sooner. Performed at [facility], [place] [address] [place], [place] [address]    Ct Head Wo Contrast  Result Date: 08/03/2018 CLINICAL DATA:  Unexpected loss of consciousness. Unwitnessed fall. EXAM: CT HEAD WITHOUT CONTRAST CT CERVICAL SPINE  WITHOUT CONTRAST TECHNIQUE: Multidetector CT imaging of the head and cervical spine was performed following the standard protocol without intravenous contrast. Multiplanar CT image reconstructions of the cervical spine were also generated. COMPARISON:  Brain MRI May 17, 2018 FINDINGS: CT HEAD FINDINGS Brain: No evidence of acute infarction, hemorrhage, hydrocephalus, extra-axial collection or mass lesion/mass effect. Moderate brain parenchymal volume loss. Chronic changes of lacunar left posterior frontal lobe infarct. Moderate chronic small vessel ischemic disease. Vascular: Calcific atherosclerotic disease of intra cavernous carotid arteries. Skull: 2.5 cm lytic lesion within the skull overlying the left frontal lobe. Slight periosteal reaction or dural thickening is seen along the inner plate at the lesion. A second 1.1 cm lytic lesion within the this skull overlying the left cerebellum. Sinuses/Orbits: No acute finding. Other: None. CT CERVICAL SPINE FINDINGS Alignment: Straightening of the cervical lordosis. Skull base and vertebrae: No evidence of acute fracture. 10 mm lytic lesion within C4 vertebral body and an ill-defined lytic lesion within the left pedicle and lateral process of C4 vertebral body. No evidence of pathologic fracture. Lytic lesion within posterior process of C2 vertebral body. Suspected lytic lesion within the lateral process of C1 vertebral body Soft tissues and spinal canal: No prevertebral fluid or swelling. No visible canal hematoma. Disc levels:  Multilevel osteoarthritic changes. Upper chest: Nodular pleural/subpleural thickening in the right apex. Other: Calcific atherosclerotic disease of the carotid arteries. IMPRESSION: 1. No acute intracranial abnormality. 2. Moderate brain parenchymal atrophy, chronic microvascular disease. 3. 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 4. Lytic lesions within the skull overlying the left frontal lobe, and left cerebellum, concerning  for osseous metastatic disease or primary bone malignancy. 5. No evidence of acute traumatic injury to the cervical spine. 6. Lytic lesions within C4, C2 and C1 vertebral bodies, concerning for osseous metastatic disease. No evidence of pathologic fractures. 7. Multilevel osteoarthritic changes of the cervical spine. 8. Nodular pleural/subpleural thickening in the right apex, which may represent pleural-parenchymal scarring, however a pulmonary nodule cannot be excluded. 9. Calcific atherosclerotic disease of the carotid arteries. Electronically Signed   By: Thomas Hoff  Dimitrova M.D.   On: 08/03/2018 10:29   Ct Cervical Spine Wo Contrast  Result Date: 08/03/2018 CLINICAL DATA:  Unexpected loss of consciousness. Unwitnessed fall. EXAM: CT HEAD WITHOUT CONTRAST CT CERVICAL SPINE WITHOUT CONTRAST TECHNIQUE: Multidetector CT imaging of the head and cervical spine was performed following the standard protocol without intravenous contrast. Multiplanar CT image reconstructions of the cervical spine were also generated. COMPARISON:  Brain MRI May 17, 2018 FINDINGS: CT HEAD FINDINGS Brain: No evidence of acute infarction, hemorrhage, hydrocephalus, extra-axial collection or mass lesion/mass effect. Moderate brain parenchymal volume loss. Chronic changes of lacunar left posterior frontal lobe infarct. Moderate chronic small vessel ischemic disease. Vascular: Calcific atherosclerotic disease of intra cavernous carotid arteries. Skull: 2.5 cm lytic lesion within the skull overlying the left frontal lobe. Slight periosteal reaction or dural thickening is seen along the inner plate at the lesion. A second 1.1 cm lytic lesion within the this skull overlying the left cerebellum. Sinuses/Orbits: No acute finding. Other: None. CT CERVICAL SPINE FINDINGS Alignment: Straightening of the cervical lordosis. Skull base and vertebrae: No evidence of acute fracture. 10 mm lytic lesion within C4 vertebral body and an ill-defined lytic  lesion within the left pedicle and lateral process of C4 vertebral body. No evidence of pathologic fracture. Lytic lesion within posterior process of C2 vertebral body. Suspected lytic lesion within the lateral process of C1 vertebral body Soft tissues and spinal canal: No prevertebral fluid or swelling. No visible canal hematoma. Disc levels:  Multilevel osteoarthritic changes. Upper chest: Nodular pleural/subpleural thickening in the right apex. Other: Calcific atherosclerotic disease of the carotid arteries. IMPRESSION: 1. No acute intracranial abnormality. 2. Moderate brain parenchymal atrophy, chronic microvascular disease. 3. 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 4. Lytic lesions within the skull overlying the left frontal lobe, and left cerebellum, concerning for osseous metastatic disease or primary bone malignancy. 5. No evidence of acute traumatic injury to the cervical spine. 6. Lytic lesions within C4, C2 and C1 vertebral bodies, concerning for osseous metastatic disease. No evidence of pathologic fractures. 7. Multilevel osteoarthritic changes of the cervical spine. 8. Nodular pleural/subpleural thickening in the right apex, which may represent pleural-parenchymal scarring, however a pulmonary nodule cannot be excluded. 9. Calcific atherosclerotic disease of the carotid arteries. Electronically Signed   By: Fidela Salisbury M.D.   On: 08/03/2018 10:29   Dg Chest Portable 1 View  Result Date: 08/03/2018 CLINICAL DATA:  Altered mental status with apparent fall earlier in the day EXAM: PORTABLE CHEST 1 VIEW COMPARISON:  Jun 12, 2018 FINDINGS: There is no edema or consolidation. There is stable scarring in the right mid lung. Heart size and pulmonary vascularity are normal. No adenopathy. Port-A-Cath tip is at the cavoatrial junction. A stent is noted in the right pulmonary artery region. No pneumothorax. No bone lesions. IMPRESSION: Mild scarring right mid lung. No edema or  consolidation. Stable cardiac silhouette. Stable central catheter positioning. Electronically Signed   By: Lowella Grip III M.D.   On: 08/03/2018 11:51    Pending Labs Unresulted Labs (From admission, onward)    Start     Ordered   08/04/18 0500  Calcium  Tomorrow morning,   R     08/03/18 1515   08/04/18 0500  CBC  Tomorrow morning,   R     08/03/18 1516   08/03/18 2000  Renal function panel  Now then every 6 hours,   R (with STAT occurrences)     08/03/18 1516   08/03/18 1507  TSH  Once,   STAT     08/03/18 1516   08/03/18 1455  CBC  (heparin)  Once,   STAT    Comments: Baseline for heparin therapy IF NOT ALREADY DRAWN.  Notify MD if PLT < 100 K.    08/03/18 1516   08/03/18 1455  Creatinine, serum  (heparin)  Once,   STAT    Comments: Baseline for heparin therapy IF NOT ALREADY DRAWN.    08/03/18 1516   08/03/18 1418  Calcitriol (1,25 di-OH Vit D)  ONCE - STAT,   STAT     08/03/18 1417   08/03/18 1416  VITAMIN D 25 Hydroxy (Vit-D Deficiency, Fractures)  ONCE - STAT,   STAT     08/03/18 1416   08/03/18 1416  PTH-related peptide  ONCE - STAT,   STAT     08/03/18 1416   08/03/18 1414  PTH, intact and calcium  ONCE - STAT,   STAT     08/03/18 1414   08/03/18 1352  Comprehensive metabolic panel  ONCE - STAT,   STAT     08/03/18 1351   08/03/18 0908  Novel Coronavirus,NAA,(SEND-OUT TO REF LAB - TAT 24-48 hrs); Hosp Order  (Asymptomatic Patients Labs)  Once,   STAT    Question:  Rule Out  Answer:  Yes   08/03/18 0907   08/03/18 0907  Urinalysis, Routine w reflex microscopic  Once,   STAT     08/03/18 0907          Vitals/Pain Today's Vitals   08/03/18 1415 08/03/18 1430 08/03/18 1445 08/03/18 1500  BP: 119/82 120/79 (!) 149/99 (!) 136/93  Pulse: (!) 115 (!) 116    Resp: 12 13 16 20   Temp:      TempSrc:      SpO2: 100% 100%    Weight:        Isolation Precautions No active isolations  Medications Medications  potassium chloride 10 mEq in 100 mL IVPB (10 mEq  Intravenous New Bag/Given 08/03/18 1515)  calcitonin (MIACALCIN) injection 392 Units (has no administration in time range)  zolendronic acid (ZOMETA) 4 mg in sodium chloride 0.9 % 100 mL IVPB (has no administration in time range)  heparin injection 5,000 Units (has no administration in time range)  sodium chloride flush (NS) 0.9 % injection 3 mL (has no administration in time range)  acetaminophen (TYLENOL) tablet 650 mg (has no administration in time range)    Or  acetaminophen (TYLENOL) suppository 650 mg (has no administration in time range)  LORazepam (ATIVAN) 2 MG/ML injection (2 mg  Given 08/03/18 0940)  lactated ringers bolus 1,000 mL (0 mLs Intravenous Stopped 08/03/18 1124)  sodium chloride 0.9 % bolus 1,000 mL (0 mLs Intravenous Stopped 08/03/18 1254)  sodium chloride 0.9 % bolus 1,000 mL (1,000 mLs Intravenous New Bag/Given 08/03/18 1434)    Mobility      Focused Assessments    R Recommendations: See Admitting Provider Note  Report given to:   Additional Notes:  [id number]

## 2018-08-03 NOTE — ED Notes (Signed)
Report given to [facility] RN

## 2018-08-03 NOTE — Progress Notes (Signed)
Attempted report 

## 2018-08-03 NOTE — ED Provider Notes (Signed)
[facility] EMERGENCY DEPARTMENT Provider Note   CSN: [id number] Arrival date & time: 08/03/18  1751     History   Chief Complaint No chief complaint on file.   HPI Bryan Baxter is a 73 y.o. male.     HPI   50yM with change in mental status. Reported history somewhat conflicts with review of records. Hx of non-small cell lung CA currently undergoing tx. Notes as recently as 3 months ago mention him living at home, describe his speech as fluent and "he can cook for himself but often times does not and chooses to drink boost/Ensure shakes up to 3 times per day. This morning had 2 boost shakes for breakfast."  I can readily tell from review of records what has happened in the interim since the end of April but he is now at [facility] Reportedly had possible seizure today. Described as deviated gaze, unresponsive and generalized shaking.   On arrival pt is non verbal. Appears to be sleeping in bed. Will moan with touch and withdrawals all extremities when they are passively manipulated but doesn't clearly seem to be in pain.   No past medical history on file.  There are no active problems to display for this patient.    Home Medications    Prior to Admission medications   Not on File    Family History No family history on file.  Social History Social History   Tobacco Use   Smoking status: Not on file  Substance Use Topics   Alcohol use: Not on file   Drug use: Not on file     Allergies   Patient has no allergy information on record.   Review of Systems Review of Systems  Level 5 caveat because pt is nonverbal.  Physical Exam Updated Vital Signs There were no vitals taken for this visit.  Physical Exam Vitals signs and nursing note reviewed.  Constitutional:      General: He is not in acute distress.    Appearance: He is well-developed.     Comments: Laying in bed with eyes closed. NAD. Cachectic.   HENT:     Head:  Normocephalic and atraumatic.     Mouth/Throat:     Mouth: Mucous membranes are dry.     Comments: Dry mucus membranes Eyes:     General:        Right eye: No discharge.        Left eye: No discharge.     Conjunctiva/sclera: Conjunctivae normal.  Neck:     Musculoskeletal: Neck supple.  Cardiovascular:     Rate and Rhythm: Regular rhythm. Tachycardia present.     Heart sounds: Normal heart sounds. No murmur. No friction rub. No gallop.      Comments: Port a cath L chest.  Pulmonary:     Effort: Pulmonary effort is normal. No respiratory distress.     Breath sounds: Normal breath sounds.  Abdominal:     General: There is no distension.     Palpations: Abdomen is soft.     Tenderness: There is no abdominal tenderness.  Musculoskeletal:        General: No tenderness.  Skin:    General: Skin is warm and dry.  Neurological:     Comments: Initially laying in bed and not doing much. Later becoming agitated and pulling off monitoring equipment. When trying to reassure him he became combative. Trying to get out of bed. Had to be physically restrained so he  wouldn't fall. Resisting this and trying to bite. Good strength in all extremities. Moaning and other incomprehensible sounds.     ED Treatments / Results  Labs (all labs ordered are listed, but only abnormal results are displayed) Labs Reviewed  CBC WITH DIFFERENTIAL/PLATELET - Abnormal; Notable for the following components:      Result Value   RBC 3.32 (*)    Hemoglobin 9.8 (*)    HCT 35.1 (*)    MCV 105.7 (*)    MCHC 27.9 (*)    RDW 20.2 (*)    Lymphs Abs 0.3 (*)    All other components within normal limits  BASIC METABOLIC PANEL - Abnormal; Notable for the following components:   Sodium 154 (*)    Potassium 2.7 (*)    Chloride 119 (*)    Glucose, Bld 117 (*)    BUN 37 (*)    Creatinine, Ser 1.32 (*)    Calcium 13.4 (*)    GFR calc non Af Amer 53 (*)    All other components within normal limits  LACTIC ACID, PLASMA -  Abnormal; Notable for the following components:   Lactic Acid, Venous 3.6 (*)    All other components within normal limits  LACTIC ACID, PLASMA - Abnormal; Notable for the following components:   Lactic Acid, Venous 2.3 (*)    All other components within normal limits  COMPREHENSIVE METABOLIC PANEL - Abnormal; Notable for the following components:   Sodium 153 (*)    Potassium 3.3 (*)    Chloride 123 (*)    CO2 21 (*)    Glucose, Bld 113 (*)    BUN 32 (*)    Calcium 12.6 (*)    Total Protein 6.0 (*)    Albumin 1.9 (*)    All other components within normal limits  CBC - Abnormal; Notable for the following components:   RBC 2.96 (*)    Hemoglobin 8.8 (*)    HCT 32.1 (*)    MCV 108.4 (*)    MCHC 27.4 (*)    RDW 20.5 (*)    All other components within normal limits  RENAL FUNCTION PANEL - Abnormal; Notable for the following components:   Sodium 154 (*)    Potassium 3.2 (*)    Chloride 126 (*)    CO2 20 (*)    BUN 31 (*)    Calcium 12.2 (*)    Phosphorus 1.9 (*)    Albumin 1.9 (*)    All other components within normal limits  RENAL FUNCTION PANEL - Abnormal; Notable for the following components:   Sodium 155 (*)    Potassium 3.0 (*)    Chloride 126 (*)    CO2 20 (*)    BUN 27 (*)    Calcium 11.8 (*)    Phosphorus 1.4 (*)    Albumin 1.9 (*)    All other components within normal limits  CBC - Abnormal; Notable for the following components:   RBC 2.92 (*)    Hemoglobin 8.6 (*)    HCT 29.2 (*)    MCHC 29.5 (*)    RDW 20.1 (*)    All other components within normal limits  SARS CORONAVIRUS 2 (HOSPITAL ORDER, [place] [facility])  NOVEL CORONAVIRUS, NAA (HOSPITAL ORDER, SEND-OUT TO REF LAB)  TSH  MAGNESIUM  MAGNESIUM  URINALYSIS, ROUTINE W REFLEX MICROSCOPIC  PTH, INTACT AND CALCIUM  VITAMIN D 25 HYDROXY (VIT D DEFICIENCY, FRACTURES)  PTH-RELATED PEPTIDE  CALCITRIOL (  1,25 DI-OH VIT D)  RENAL FUNCTION PANEL  RENAL FUNCTION PANEL  RENAL FUNCTION  PANEL    EKG EKG Interpretation  Date/Time:  Saturday August 03 2018 09:03:25 EDT Ventricular Rate:  129 PR Interval:    QRS Duration: 112 QT Interval:  273 QTC Calculation: 402 R Axis:   65 Text Interpretation:  suspect sinus tachycardia. pt shaking/has tremor. less likely atrial fibrillation Borderline ST elevation, lateral leads Confirmed by Virgel Manifold [phone]) on 08/03/2018 9:52:02 AM   Radiology Ct Head Wo Contrast  Result Date: 08/03/2018 CLINICAL DATA:  Unexpected loss of consciousness. Unwitnessed fall. EXAM: CT HEAD WITHOUT CONTRAST CT CERVICAL SPINE WITHOUT CONTRAST TECHNIQUE: Multidetector CT imaging of the head and cervical spine was performed following the standard protocol without intravenous contrast. Multiplanar CT image reconstructions of the cervical spine were also generated. COMPARISON:  Brain MRI May 17, 2018 FINDINGS: CT HEAD FINDINGS Brain: No evidence of acute infarction, hemorrhage, hydrocephalus, extra-axial collection or mass lesion/mass effect. Moderate brain parenchymal volume loss. Chronic changes of lacunar left posterior frontal lobe infarct. Moderate chronic small vessel ischemic disease. Vascular: Calcific atherosclerotic disease of intra cavernous carotid arteries. Skull: 2.5 cm lytic lesion within the skull overlying the left frontal lobe. Slight periosteal reaction or dural thickening is seen along the inner plate at the lesion. A second 1.1 cm lytic lesion within the this skull overlying the left cerebellum. Sinuses/Orbits: No acute finding. Other: None. CT CERVICAL SPINE FINDINGS Alignment: Straightening of the cervical lordosis. Skull base and vertebrae: No evidence of acute fracture. 10 mm lytic lesion within C4 vertebral body and an ill-defined lytic lesion within the left pedicle and lateral process of C4 vertebral body. No evidence of pathologic fracture. Lytic lesion within posterior process of C2 vertebral body. Suspected lytic lesion within the  lateral process of C1 vertebral body Soft tissues and spinal canal: No prevertebral fluid or swelling. No visible canal hematoma. Disc levels:  Multilevel osteoarthritic changes. Upper chest: Nodular pleural/subpleural thickening in the right apex. Other: Calcific atherosclerotic disease of the carotid arteries. IMPRESSION: 1. No acute intracranial abnormality. 2. Moderate brain parenchymal atrophy, chronic microvascular disease. 3. 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 4. Lytic lesions within the skull overlying the left frontal lobe, and left cerebellum, concerning for osseous metastatic disease or primary bone malignancy. 5. No evidence of acute traumatic injury to the cervical spine. 6. Lytic lesions within C4, C2 and C1 vertebral bodies, concerning for osseous metastatic disease. No evidence of pathologic fractures. 7. Multilevel osteoarthritic changes of the cervical spine. 8. Nodular pleural/subpleural thickening in the right apex, which may represent pleural-parenchymal scarring, however a pulmonary nodule cannot be excluded. 9. Calcific atherosclerotic disease of the carotid arteries. Electronically Signed   By: Fidela Salisbury M.D.   On: 08/03/2018 10:29   Ct Cervical Spine Wo Contrast  Result Date: 08/03/2018 CLINICAL DATA:  Unexpected loss of consciousness. Unwitnessed fall. EXAM: CT HEAD WITHOUT CONTRAST CT CERVICAL SPINE WITHOUT CONTRAST TECHNIQUE: Multidetector CT imaging of the head and cervical spine was performed following the standard protocol without intravenous contrast. Multiplanar CT image reconstructions of the cervical spine were also generated. COMPARISON:  Brain MRI May 17, 2018 FINDINGS: CT HEAD FINDINGS Brain: No evidence of acute infarction, hemorrhage, hydrocephalus, extra-axial collection or mass lesion/mass effect. Moderate brain parenchymal volume loss. Chronic changes of lacunar left posterior frontal lobe infarct. Moderate chronic small vessel ischemic  disease. Vascular: Calcific atherosclerotic disease of intra cavernous carotid arteries. Skull: 2.5 cm lytic lesion within the skull  overlying the left frontal lobe. Slight periosteal reaction or dural thickening is seen along the inner plate at the lesion. A second 1.1 cm lytic lesion within the this skull overlying the left cerebellum. Sinuses/Orbits: No acute finding. Other: None. CT CERVICAL SPINE FINDINGS Alignment: Straightening of the cervical lordosis. Skull base and vertebrae: No evidence of acute fracture. 10 mm lytic lesion within C4 vertebral body and an ill-defined lytic lesion within the left pedicle and lateral process of C4 vertebral body. No evidence of pathologic fracture. Lytic lesion within posterior process of C2 vertebral body. Suspected lytic lesion within the lateral process of C1 vertebral body Soft tissues and spinal canal: No prevertebral fluid or swelling. No visible canal hematoma. Disc levels:  Multilevel osteoarthritic changes. Upper chest: Nodular pleural/subpleural thickening in the right apex. Other: Calcific atherosclerotic disease of the carotid arteries. IMPRESSION: 1. No acute intracranial abnormality. 2. Moderate brain parenchymal atrophy, chronic microvascular disease. 3. 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 4. Lytic lesions within the skull overlying the left frontal lobe, and left cerebellum, concerning for osseous metastatic disease or primary bone malignancy. 5. No evidence of acute traumatic injury to the cervical spine. 6. Lytic lesions within C4, C2 and C1 vertebral bodies, concerning for osseous metastatic disease. No evidence of pathologic fractures. 7. Multilevel osteoarthritic changes of the cervical spine. 8. Nodular pleural/subpleural thickening in the right apex, which may represent pleural-parenchymal scarring, however a pulmonary nodule cannot be excluded. 9. Calcific atherosclerotic disease of the carotid arteries. Electronically Signed   By:  Fidela Salisbury M.D.   On: 08/03/2018 10:29   Dg Chest Portable 1 View  Result Date: 08/03/2018 CLINICAL DATA:  Altered mental status with apparent fall earlier in the day EXAM: PORTABLE CHEST 1 VIEW COMPARISON:  Jun 12, 2018 FINDINGS: There is no edema or consolidation. There is stable scarring in the right mid lung. Heart size and pulmonary vascularity are normal. No adenopathy. Port-A-Cath tip is at the cavoatrial junction. A stent is noted in the right pulmonary artery region. No pneumothorax. No bone lesions. IMPRESSION: Mild scarring right mid lung. No edema or consolidation. Stable cardiac silhouette. Stable central catheter positioning. Electronically Signed   By: Lowella Grip III M.D.   On: 08/03/2018 11:51    Procedures Procedures (including critical care time)  CRITICAL CARE Performed by: Virgel Manifold Total critical care time: 35 minutes Critical care time was exclusive of separately billable procedures and treating other patients. Critical care was necessary to treat or prevent imminent or life-threatening deterioration. Critical care was time spent personally by me on the following activities: development of treatment plan with patient and/or surrogate as well as nursing, discussions with consultants, evaluation of patient's response to treatment, examination of patient, obtaining history from patient or surrogate, ordering and performing treatments and interventions, ordering and review of laboratory studies, ordering and review of radiographic studies, pulse oximetry and re-evaluation of patient's condition.   Medications Ordered in ED Medications - No data to display   Initial Impression / Assessment and Plan / ED Course  I have reviewed the triage vital signs and the nursing notes.  Pertinent labs & imaging results that were available during my care of the patient were reviewed by me and considered in my medical decision making (see chart for details).         73yM with altered mental status. From hypercalcemia? Multiple lytic lesions noted on imaging and this could be the underlying etiology. Also hypokalemia. Hypernatremia and AKI consistent with dehydration.  CT w/o acute abnormality. No urine when tried to cath. Nursing reporting that it seemed like catheter passed prostate but no urine. May just be very dry.  IVF. Replete potassium. Admit.   Final Clinical Impressions(s) / ED Diagnoses   Final diagnoses:  Altered mental status, unspecified altered mental status type  Hypercalcemia  Dehydration    ED Discharge Orders    None       Virgel Manifold, MD 08/04/18 [phone]

## 2018-08-03 NOTE — Progress Notes (Signed)
EEG complete - results pending 

## 2018-08-04 DIAGNOSIS — E46 Unspecified protein-calorie malnutrition: Secondary | ICD-10-CM | POA: Diagnosis present

## 2018-08-04 DIAGNOSIS — R4182 Altered mental status, unspecified: Secondary | ICD-10-CM

## 2018-08-04 LAB — RENAL FUNCTION PANEL
Albumin: 1.9 g/dL — ABNORMAL LOW (ref 3.5–5.0)
Albumin: 1.9 g/dL — ABNORMAL LOW (ref 3.5–5.0)
Albumin: 1.8 g/dL — ABNORMAL LOW (ref 3.5–5.0)
Albumin: 1.7 g/dL — ABNORMAL LOW (ref 3.5–5.0)
Anion gap: 9 (ref 5–15)
Anion gap: 10 (ref 5–15)
Anion gap: 8 (ref 5–15)
Anion gap: 7 (ref 5–15)
BUN: 27 mg/dL — ABNORMAL HIGH (ref 8–23)
BUN: 24 mg/dL — ABNORMAL HIGH (ref 8–23)
BUN: 20 mg/dL (ref 8–23)
BUN: 14 mg/dL (ref 8–23)
CO2: 20 mmol/L — ABNORMAL LOW (ref 22–32)
CO2: 20 mmol/L — ABNORMAL LOW (ref 22–32)
CO2: 21 mmol/L — ABNORMAL LOW (ref 22–32)
CO2: 22 mmol/L (ref 22–32)
Calcium: 11.8 mg/dL — ABNORMAL HIGH (ref 8.9–10.3)
Calcium: 11.7 mg/dL — ABNORMAL HIGH (ref 8.9–10.3)
Calcium: 11.1 mg/dL — ABNORMAL HIGH (ref 8.9–10.3)
Calcium: 10 mg/dL (ref 8.9–10.3)
Chloride: 126 mmol/L — ABNORMAL HIGH (ref 98–111)
Chloride: 129 mmol/L — ABNORMAL HIGH (ref 98–111)
Chloride: 126 mmol/L — ABNORMAL HIGH (ref 98–111)
Chloride: 122 mmol/L — ABNORMAL HIGH (ref 98–111)
Creatinine, Ser: 0.9 mg/dL (ref 0.61–1.24)
Creatinine, Ser: 0.96 mg/dL (ref 0.61–1.24)
Creatinine, Ser: 0.89 mg/dL (ref 0.61–1.24)
Creatinine, Ser: 0.9 mg/dL (ref 0.61–1.24)
GFR calc Af Amer: >60 mL/min (ref >60)
GFR calc Af Amer: >60 mL/min (ref >60)
GFR calc Af Amer: >60 mL/min (ref >60)
GFR calc Af Amer: >60 mL/min (ref >60)
GFR calc non Af Amer: >60 mL/min (ref >60)
GFR calc non Af Amer: >60 mL/min (ref >60)
GFR calc non Af Amer: >60 mL/min (ref >60)
GFR calc non Af Amer: >60 mL/min (ref >60)
Glucose, Bld: 82 mg/dL (ref 70–99)
Glucose, Bld: 70 mg/dL (ref 70–99)
Glucose, Bld: 163 mg/dL — ABNORMAL HIGH (ref 70–99)
Glucose, Bld: 193 mg/dL — ABNORMAL HIGH (ref 70–99)
Phosphorus: 1.4 mg/dL — ABNORMAL LOW (ref 2.5–4.6)
Phosphorus: 1.7 mg/dL — ABNORMAL LOW (ref 2.5–4.6)
Phosphorus: <1 mg/dL — CL (ref 2.5–4.6)
Phosphorus: 2 mg/dL — ABNORMAL LOW (ref 2.5–4.6)
Potassium: 3 mmol/L — ABNORMAL LOW (ref 3.5–5.1)
Potassium: 3.6 mmol/L (ref 3.5–5.1)
Potassium: 3.2 mmol/L — ABNORMAL LOW (ref 3.5–5.1)
Potassium: 3.2 mmol/L — ABNORMAL LOW (ref 3.5–5.1)
Sodium: 155 mmol/L — ABNORMAL HIGH (ref 135–145)
Sodium: 159 mmol/L — ABNORMAL HIGH (ref 135–145)
Sodium: 155 mmol/L — ABNORMAL HIGH (ref 135–145)
Sodium: 151 mmol/L — ABNORMAL HIGH (ref 135–145)

## 2018-08-04 LAB — CBC
HCT: 29.2 % — ABNORMAL LOW (ref 39.0–52.0)
Hemoglobin: 8.6 g/dL — ABNORMAL LOW (ref 13.0–17.0)
MCH: 29.5 pg (ref 26.0–34.0)
MCHC: 29.5 g/dL — ABNORMAL LOW (ref 30.0–36.0)
MCV: 100 fL (ref 80.0–100.0)
Platelets: 153 10*3/uL (ref 150–400)
RBC: 2.92 MIL/uL — ABNORMAL LOW (ref 4.22–5.81)
RDW: 20.1 % — ABNORMAL HIGH (ref 11.5–15.5)
WBC: 8.6 10*3/uL (ref 4.0–10.5)
nRBC: 0.2 % (ref 0.0–0.2)

## 2018-08-04 LAB — MAGNESIUM: Magnesium: 2.1 mg/dL (ref 1.7–2.4)

## 2018-08-04 LAB — URINALYSIS, ROUTINE W REFLEX MICROSCOPIC
Bilirubin Urine: NEGATIVE
Glucose, UA: 50 mg/dL — AB
Hgb urine dipstick: NEGATIVE
Ketones, ur: NEGATIVE mg/dL
Leukocytes,Ua: NEGATIVE
Nitrite: NEGATIVE
Protein, ur: NEGATIVE mg/dL
Specific Gravity, Urine: 1.009 (ref 1.005–1.030)
pH: 7 (ref 5.0–8.0)

## 2018-08-04 LAB — OSMOLALITY, URINE: Osmolality, Ur: 404 mOsm/kg (ref 300–900)

## 2018-08-04 MED ORDER — POTASSIUM PHOSPHATES 15 MMOLE/5ML IV SOLN
30.0000 mmol | Freq: Once | INTRAVENOUS | Status: AC
  Administered 2018-08-04: 30 mmol via INTRAVENOUS
  Filled 2018-08-04: qty 10

## 2018-08-04 MED ORDER — POTASSIUM CHLORIDE 10 MEQ/100ML IV SOLN
10.0000 meq | INTRAVENOUS | Status: AC
  Administered 2018-08-04 (×4): 10 meq via INTRAVENOUS
  Filled 2018-08-04 (×4): qty 100

## 2018-08-04 MED ORDER — DEXTROSE-NACL 5-0.45 % IV SOLN
INTRAVENOUS | Status: DC
  Administered 2018-08-04 (×3): via INTRAVENOUS

## 2018-08-04 MED ORDER — DEXTROSE 5 % IV SOLN
INTRAVENOUS | Status: DC
  Administered 2018-08-04: 23:00:00 via INTRAVENOUS
  Filled 2018-08-04: qty 1000

## 2018-08-04 NOTE — Progress Notes (Signed)
  Date: 08/04/2018  Patient name: Bryan Baxter  Medical record number: [id number]  Date of birth: 1945/05/30   I have seen and evaluated this patient and I have discussed the plan of care with the house staff. Please see their note for complete details. I concur with their findings with the following additions/corrections:   Unfortunately, still quite somnolent, but more active than yesterday. Hypercalcemia is improving with IV fluids, calcitriol, and bisophosphonate. Hypernatremia has not corrected yet, will give additional free water today and trend. Will check urine osmolality to evaluate for DI given persistent hypernatremia. EEG did not show seizure activity, hopefully tachycardia will improve with rehydration.   Lenice Pressman, M.D., Ph.D. 08/04/2018, 6:04 PM

## 2018-08-04 NOTE — Progress Notes (Signed)
TC from [facility] stating patient HR sustaining at 145. MD paged to let them know BP 134/104. Return call from MD, will wait and watch for now since patient is so dry HR is most likely due to his dehydration. Will continue to assess as needed. Cathlean Marseilles Tamala Julian, MSN, RN, Agency, AGCNS

## 2018-08-04 NOTE — Procedures (Signed)
History: 73 year old male being evaluated for encephalopathy, rule out status epilepticus  Sedation: None  Technique: This is a 21 channel stat scalp EEG performed at the bedside with bipolar and monopolar montages arranged in accordance to the international 10/20 system of electrode placement. One channel was dedicated to EKG recording.    Background: The background consists of predominantly of low voltage generalized irregular delta range activity.  There is an occasional posterior dominant rhythm of 9 Hz which is poorly sustained.  No clear sleep was seen.  Frequently, the patient has tremor and prominent tremor artifact consisting of 6 Hz activity present at T4, T6, 01, P3 is visualized, often also seen in the EKG lead.  Photic stimulation: Physiologic driving is not performed  EEG Abnormalities: 1) Generalized irregular slow activity   Clinical Interpretation: This EEG is consistent with a generalized nonspecific cerebral dysfunction (encephalopathy).  There was no seizure or seizure predisposition recorded on this study. Please note that lack of epileptiform activity on EEG does not preclude the possibility of epilepsy.   Roland Rack, MD [facility] [phone]  If 7pm- 7am, please page neurology on call as listed in [place].

## 2018-08-04 NOTE — Plan of Care (Signed)

## 2018-08-04 NOTE — Progress Notes (Signed)
Subjective: Patient was examined at the bedside this morning. He was not verbally responsive but was able to follow verbal commands intermittently by squeezing his hands and lifting his legs. He seems uncomfortable. We also noticed new tremors in both of his upper extremities. He does have more purposeful movements today compared to yesterday   Objective:  Vital signs in last 24 hours: Vitals:   08/03/18 2002 08/04/18 0426 08/04/18 0900 08/04/18 0957  BP: (!) 114/56  (!) 150/85   Pulse: (!) 127  (!) 133   Resp: (!) 25  (!) 25   Temp: (!) 97.5 F (36.4 C) 97.9 F (36.6 C)  98.1 F (36.7 C)  TempSrc: Axillary Axillary    SpO2: 98%  97%   Weight:       Physical Exam Constitutional:      General: He is in acute distress.     Appearance: He is ill-appearing. He is not diaphoretic.  HENT:     Head: Normocephalic.     Comments: Temporal wasting    Mouth/Throat:     Mouth: Mucous membranes are dry.     Pharynx: No oropharyngeal exudate or posterior oropharyngeal erythema.     Comments: Saliva thickened Eyes:     General: No scleral icterus.       Right eye: No discharge.        Left eye: No discharge.     Pupils: Pupils are equal, round, and reactive to light.  Cardiovascular:     Rate and Rhythm: Regular rhythm. Tachycardia present.     Pulses: Normal pulses.     Heart sounds: Normal heart sounds. No murmur. No friction rub. No gallop.   Pulmonary:     Effort: Pulmonary effort is normal. No respiratory distress.     Breath sounds: Normal breath sounds. No wheezing or rales.  Abdominal:     General: Abdomen is flat. There is no distension.     Palpations: Abdomen is soft.     Tenderness: There is no abdominal tenderness. There is no guarding.  Musculoskeletal:     Right lower leg: No edema.     Left lower leg: No edema.    Assessment/Plan:  Principal Problem:   Hypercalcemia Active Problems:   Acute encephalopathy   Hypernatremia   Hypokalemia  In summary Mr.  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  Per chart review, the patient was cooking for himself and mobile with a cane a couple months ago.  This is a large an acute change from his baseline.  #Altered Mental Status #Hypernatremia #Hypokalemia #Hypercalcemia #Seizure? #FEN/GI: Reportedly had a possible seizure at [facility], however we are unable to confirm this.  He was described to have a deviated gaze, was unresponsive and having generalized shaking.  He had no seizure-like activity in the ED, however he was agitated and resisting staff.  He received 2mg  Ativan for agitation.  On our exam he was unresponsive, but withdrew to painful stimuli in all 4 extremities.  Despite our inability to confirm seizure-like activity, it is highly suspicious given severe electrolyte imbalances. It certainly plausible that he had a seizure because of this. He is also hypovolemic as evidenced by dry appearance on physical exam and absent urine output on cath attempt per nursing. We will plan to correct his electrolyte abnormalities with IV fluids.    He seems  more interactive although altered, then yesterday.   - D5 1/2NS infusion at 300 mL/hr for 12 hours: total 3.6L. Calculated 2.5L deficit - Potassium phosphate ordered, critical value of 1.0 phosphate on labs today - continue Calcitonin injection twice daily, Zomet 4mg  IV  - Follow-up EEG to evaluate for ongoing seizures - NPO - Continue holding home oxybutynin, tamsulosin, albuterol, atorvastatin, amitriptyline, baby aspirin Megace, metoprolol, sucralfate, Mag-Ox  #Small cell lung cancer: Likely the cause of his electrolyte imbalances.  Lytic lesions are present diffusely in his skull and spine as evidenced on the CT of the spine and head. Currently on palliative radiotherapy per [facility] hematology. - Will ensure f/u upon  discharge.  Dispo: Anticipated discharge 3-4 days  Earlene Plater, MD Internal Medicine, PGY1 Pager: [phone]  08/04/2018,5:19 PM

## 2018-08-04 NOTE — Progress Notes (Addendum)
TC from lab with critical phosphorus of < 1.0 and TC from [facility] 5 beats of wide QRS. MD notified

## 2018-08-05 DIAGNOSIS — E860 Dehydration: Secondary | ICD-10-CM

## 2018-08-05 DIAGNOSIS — D649 Anemia, unspecified: Secondary | ICD-10-CM

## 2018-08-05 LAB — RENAL FUNCTION PANEL
Albumin: 1.7 g/dL — ABNORMAL LOW (ref 3.5–5.0)
Albumin: 1.8 g/dL — ABNORMAL LOW (ref 3.5–5.0)
Albumin: 1.8 g/dL — ABNORMAL LOW (ref 3.5–5.0)
Anion gap: 11 (ref 5–15)
Anion gap: 11 (ref 5–15)
Anion gap: 9 (ref 5–15)
BUN: 11 mg/dL (ref 8–23)
BUN: 10 mg/dL (ref 8–23)
BUN: 9 mg/dL (ref 8–23)
CO2: 18 mmol/L — ABNORMAL LOW (ref 22–32)
CO2: 21 mmol/L — ABNORMAL LOW (ref 22–32)
CO2: 22 mmol/L (ref 22–32)
Calcium: 9.4 mg/dL (ref 8.9–10.3)
Calcium: 9.3 mg/dL (ref 8.9–10.3)
Calcium: 9.1 mg/dL (ref 8.9–10.3)
Chloride: 116 mmol/L — ABNORMAL HIGH (ref 98–111)
Chloride: 113 mmol/L — ABNORMAL HIGH (ref 98–111)
Chloride: 115 mmol/L — ABNORMAL HIGH (ref 98–111)
Creatinine, Ser: 0.76 mg/dL (ref 0.61–1.24)
Creatinine, Ser: 0.8 mg/dL (ref 0.61–1.24)
Creatinine, Ser: 0.75 mg/dL (ref 0.61–1.24)
GFR calc Af Amer: >60 mL/min (ref >60)
GFR calc Af Amer: >60 mL/min (ref >60)
GFR calc Af Amer: >60 mL/min (ref >60)
GFR calc non Af Amer: >60 mL/min (ref >60)
GFR calc non Af Amer: >60 mL/min (ref >60)
GFR calc non Af Amer: >60 mL/min (ref >60)
Glucose, Bld: 140 mg/dL — ABNORMAL HIGH (ref 70–99)
Glucose, Bld: 109 mg/dL — ABNORMAL HIGH (ref 70–99)
Glucose, Bld: 117 mg/dL — ABNORMAL HIGH (ref 70–99)
Phosphorus: 2.8 mg/dL (ref 2.5–4.6)
Phosphorus: 2.9 mg/dL (ref 2.5–4.6)
Phosphorus: 2.1 mg/dL — ABNORMAL LOW (ref 2.5–4.6)
Potassium: 3.6 mmol/L (ref 3.5–5.1)
Potassium: 3.8 mmol/L (ref 3.5–5.1)
Potassium: 3.4 mmol/L — ABNORMAL LOW (ref 3.5–5.1)
Sodium: 145 mmol/L (ref 135–145)
Sodium: 145 mmol/L (ref 135–145)
Sodium: 146 mmol/L — ABNORMAL HIGH (ref 135–145)

## 2018-08-05 LAB — CBC
HCT: 27.3 % — ABNORMAL LOW (ref 39.0–52.0)
Hemoglobin: 8 g/dL — ABNORMAL LOW (ref 13.0–17.0)
MCH: 29.1 pg (ref 26.0–34.0)
MCHC: 29.3 g/dL — ABNORMAL LOW (ref 30.0–36.0)
MCV: 99.3 fL (ref 80.0–100.0)
Platelets: 137 10*3/uL — ABNORMAL LOW (ref 150–400)
RBC: 2.75 MIL/uL — ABNORMAL LOW (ref 4.22–5.81)
RDW: 20.7 % — ABNORMAL HIGH (ref 11.5–15.5)
WBC: 9.6 10*3/uL (ref 4.0–10.5)
nRBC: 0.2 % (ref 0.0–0.2)

## 2018-08-05 LAB — IRON AND TIBC
Iron: 22 ug/dL — ABNORMAL LOW (ref 45–182)
Saturation Ratios: 14 % — ABNORMAL LOW (ref 17.9–39.5)
TIBC: 158 ug/dL — ABNORMAL LOW (ref 250–450)
UIBC: 136 ug/dL

## 2018-08-05 LAB — CALCITRIOL (1,25 DI-OH VIT D): Vit D, 1,25-Dihydroxy: 20.6 pg/mL (ref 19.9–79.3)

## 2018-08-05 LAB — NOVEL CORONAVIRUS, NAA (HOSP ORDER, SEND-OUT TO REF LAB; TAT 18-24 HRS): SARS-CoV-2, NAA: NOT DETECTED

## 2018-08-05 LAB — PTH, INTACT AND CALCIUM
Calcium, Total (PTH): 12.6 mg/dL — ABNORMAL HIGH (ref 8.6–10.2)
PTH: 6 pg/mL — ABNORMAL LOW (ref 15–65)

## 2018-08-05 LAB — VITAMIN D 25 HYDROXY (VIT D DEFICIENCY, FRACTURES): Vit D, 25-Hydroxy: 23.1 ng/mL — ABNORMAL LOW (ref 30.0–100.0)

## 2018-08-05 LAB — CALCIUM: Calcium: 9.1 mg/dL (ref 8.9–10.3)

## 2018-08-05 LAB — FERRITIN: Ferritin: 1949 ng/mL — ABNORMAL HIGH (ref 24–336)

## 2018-08-05 MED ORDER — DEXTROSE-NACL 5-0.45 % IV SOLN
INTRAVENOUS | Status: DC
  Administered 2018-08-06: via INTRAVENOUS

## 2018-08-05 MED ORDER — HYDROMORPHONE HCL 1 MG/ML IJ SOLN
0.5000 mg | INTRAMUSCULAR | Status: DC | PRN
  Administered 2018-08-05: 0.5 mg via INTRAVENOUS
  Filled 2018-08-05: qty 0.5

## 2018-08-05 MED ORDER — KCL IN DEXTROSE-NACL 40-5-0.45 MEQ/L-%-% IV SOLN
INTRAVENOUS | Status: DC
  Filled 2018-08-05: qty 1000

## 2018-08-05 MED ORDER — ORAL CARE MOUTH RINSE
15.0000 mL | Freq: Two times a day (BID) | OROMUCOSAL | Status: DC
  Administered 2018-08-06 – 2018-08-07 (×3): 15 mL via OROMUCOSAL

## 2018-08-05 MED ORDER — CHLORHEXIDINE GLUCONATE 0.12 % MT SOLN
15.0000 mL | Freq: Two times a day (BID) | OROMUCOSAL | Status: DC
  Administered 2018-08-06 (×2): 15 mL via OROMUCOSAL
  Filled 2018-08-05: qty 15

## 2018-08-05 MED ORDER — CALCITONIN (SALMON) 200 UNIT/ML IJ SOLN
4.0000 [IU]/kg | Freq: Two times a day (BID) | INTRAMUSCULAR | Status: AC
  Administered 2018-08-05: 186 [IU] via INTRAMUSCULAR
  Filled 2018-08-05 (×2): qty 0.93

## 2018-08-05 MED ORDER — SODIUM CHLORIDE 0.9 % IV SOLN
INTRAVENOUS | Status: DC
  Administered 2018-08-05: 06:00:00 via INTRAVENOUS

## 2018-08-05 NOTE — Progress Notes (Signed)
Spoke to MD about pt high MEWS score related to high HR and Respirations. MD placed palliative consult and PRN pain meds.

## 2018-08-05 NOTE — Progress Notes (Signed)
Spoke to daughter via phone. Updated her on plan of care and patient condition. Questions answered to satisfaction.

## 2018-08-05 NOTE — Progress Notes (Signed)
  Date: 08/05/2018  Patient name: Bryan Baxter  Medical record number: [id number]  Date of birth: Apr 23, 1945   I have seen and evaluated this patient and I have discussed the plan of care with the house staff. Please see Dr. Redgie Grayer note for complete details. I concur with his findings and plan.    Consider Palliative Care Consult.  He may no longer be able to tolerate further chemotherapy and will be in need of transition to a comfort based strategy of care.   Sid Falcon, MD 08/05/2018, 4:32 PM

## 2018-08-05 NOTE — Progress Notes (Signed)
Subjective: Pt asleep in bed on exam. Woken and responds with movement to stimuli. Non- verbal and hard to keep aroused.  Patient moans and groans however does not seem to to be in any pain in any particular location.  Objective:  Vital signs in last 24 hours: Vitals:   08/05/18 0004 08/05/18 0343 08/05/18 0425 08/05/18 0734  BP: (!) 124/95 133/83    Pulse: (!) 135 (!) 126    Resp: (!) 24 (!) 22  (!) 21  Temp:   98.8 F (37.1 C)   TempSrc:   Axillary   SpO2: 98% 96%    Weight:       Physical Exam Constitutional:      General: He is in acute distress.     Appearance: He is ill-appearing. He is not diaphoretic.     Comments: Cachectic  HENT:     Head: Normocephalic.     Comments: Temporal wasting    Mouth/Throat:     Mouth: Mucous membranes are dry.  Eyes:     General: No scleral icterus.       Right eye: No discharge.        Left eye: No discharge.     Pupils: Pupils are equal, round, and reactive to light.  Cardiovascular:     Rate and Rhythm: Regular rhythm. Tachycardia present.     Pulses: Normal pulses.     Heart sounds: Normal heart sounds. No murmur. No friction rub. No gallop.   Pulmonary:     Effort: Pulmonary effort is normal. No respiratory distress.     Breath sounds: Normal breath sounds. No wheezing or rales.  Abdominal:     General: Abdomen is flat. Bowel sounds are normal. There is no distension.     Palpations: Abdomen is soft.     Tenderness: There is no abdominal tenderness. There is no guarding.  Musculoskeletal:     Comments: Spontaneously moves all 4 extremities to noxious stimuli.  Skin:    General: Skin is warm and dry.     Comments: Poor skin turgor  Neurological:     Mental Status: He is disoriented.  Psychiatric:     Comments: Unable to assess    Assessment/Plan:  Principal Problem:   Hypercalcemia Active Problems:   Acute encephalopathy   Hypernatremia   Hypokalemia   Protein-calorie malnutrition ([place])  In summaryMr.  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 This is a large an acute change from his baseline.  #Altered Mental Status #Hypernatremia #Hypokalemia #Hypercalcemia #Seizure? #FEN/GI:Reportedly had a possible seizure at [facility] place, however we are unable to confirm this. He was described to have a deviated gaze, was unresponsive and having generalized shaking. He had no seizure-like activity in the ED, however he was agitated and resisting staff. He received 2mg Ativan for agitation. On our exam he was unresponsive, but withdrew to painful stimuli in all 4 extremities. Despite our inability to confirm seizure-like activity, it is highly suspicious given severe electrolyte imbalances.It certainly plausible that he hada seizure because of this. He is also hypovolemic as evidenced by dry appearance on physical exam and absent urine output on cath attempt per nursing. We will plan to correct his electrolyte abnormalitieswithIV fluids.  He seems more interactive although altered, then yesterday.   - Completed D5  1/2NS infusion at 300 mL/hr for 12 hours. total 3.6L. Calculated 2.5L deficit.  - Today, NS infusion 151mL/hr for 12 hrs. Total 1.2L  - Calcitonin injectiontwice daily - last dose today.  - No seizures on EEG -MRI brain with and without contrast today.  Evaluating for potential metastases to the brain contributing to altered mental status. - NPO -Continue holding home oxybutynin, tamsulosin, albuterol, atorvastatin, amitriptyline, baby aspirin Megace, metoprolol, sucralfate, Mag-Ox  #Anemia: CBC notable 8.0 hemoglobin.  Likely partially due to dilution from IV fluids.  However due to his persistent tachycardia we will work this up in search of reversible causes  - Iron studies (Iron, Ferratin, TIBC)  #Small cell lung cancer:Likely the cause of his electrolyte imbalances. Lytic lesions are present diffusely in his skull and spine as evidenced on the CT of the spine and head. Currently on [place] hematology. - Will ensure f/u upon discharge.  Dispo: Anticipated discharge 3-4 days  Earlene Plater, MD Internal Medicine, PGY1 Pager: [phone]  08/05/2018,11:04 AM

## 2018-08-06 ENCOUNTER — Inpatient Hospital Stay (HOSPITAL_COMMUNITY): Payer: Medicare Other

## 2018-08-06 DIAGNOSIS — Z515 Encounter for palliative care: Secondary | ICD-10-CM

## 2018-08-06 DIAGNOSIS — E861 Hypovolemia: Secondary | ICD-10-CM

## 2018-08-06 DIAGNOSIS — Z7189 Other specified counseling: Secondary | ICD-10-CM

## 2018-08-06 LAB — RENAL FUNCTION PANEL
Albumin: 1.7 g/dL — ABNORMAL LOW (ref 3.5–5.0)
Albumin: 1.6 g/dL — ABNORMAL LOW (ref 3.5–5.0)
Anion gap: 8 (ref 5–15)
Anion gap: 8 (ref 5–15)
BUN: 8 mg/dL (ref 8–23)
BUN: 8 mg/dL (ref 8–23)
CO2: 23 mmol/L (ref 22–32)
CO2: 21 mmol/L — ABNORMAL LOW (ref 22–32)
Calcium: 8.8 mg/dL — ABNORMAL LOW (ref 8.9–10.3)
Calcium: 8.6 mg/dL — ABNORMAL LOW (ref 8.9–10.3)
Chloride: 113 mmol/L — ABNORMAL HIGH (ref 98–111)
Chloride: 114 mmol/L — ABNORMAL HIGH (ref 98–111)
Creatinine, Ser: 0.65 mg/dL (ref 0.61–1.24)
Creatinine, Ser: 0.65 mg/dL (ref 0.61–1.24)
GFR calc Af Amer: >60 mL/min (ref >60)
GFR calc Af Amer: >60 mL/min (ref >60)
GFR calc non Af Amer: >60 mL/min (ref >60)
GFR calc non Af Amer: >60 mL/min (ref >60)
Glucose, Bld: 123 mg/dL — ABNORMAL HIGH (ref 70–99)
Glucose, Bld: 124 mg/dL — ABNORMAL HIGH (ref 70–99)
Phosphorus: 2 mg/dL — ABNORMAL LOW (ref 2.5–4.6)
Phosphorus: 1.8 mg/dL — ABNORMAL LOW (ref 2.5–4.6)
Potassium: 3.1 mmol/L — ABNORMAL LOW (ref 3.5–5.1)
Potassium: 3 mmol/L — ABNORMAL LOW (ref 3.5–5.1)
Sodium: 144 mmol/L (ref 135–145)
Sodium: 143 mmol/L (ref 135–145)

## 2018-08-06 MED ORDER — POTASSIUM CHLORIDE 2 MEQ/ML IV SOLN
INTRAVENOUS | Status: DC
  Administered 2018-08-06: 12:00:00 via INTRAVENOUS
  Filled 2018-08-06: qty 1000

## 2018-08-06 MED ORDER — GLYCOPYRROLATE 0.2 MG/ML IJ SOLN: 0.2000 mg | INTRAMUSCULAR | Status: DC | PRN

## 2018-08-06 MED ORDER — MORPHINE SULFATE (PF) 2 MG/ML IV SOLN: 2.0000 mg | INTRAVENOUS | Status: DC | PRN

## 2018-08-06 MED ORDER — GADOBUTROL 1 MMOL/ML IV SOLN
5.0000 mL | Freq: Once | INTRAVENOUS | Status: AC | PRN
  Administered 2018-08-06: 5 mL via INTRAVENOUS

## 2018-08-06 MED ORDER — HALOPERIDOL LACTATE 2 MG/ML PO CONC
0.5000 mg | ORAL | Status: DC | PRN
  Filled 2018-08-06: qty 0.3

## 2018-08-06 MED ORDER — LORAZEPAM 1 MG PO TABS: 1.0000 mg | ORAL_TABLET | ORAL | Status: DC | PRN

## 2018-08-06 MED ORDER — LORAZEPAM 2 MG/ML IJ SOLN
1.0000 mg | INTRAMUSCULAR | Status: DC | PRN
  Administered 2018-08-07: 1 mg via INTRAVENOUS
  Filled 2018-08-06: qty 1

## 2018-08-06 MED ORDER — HALOPERIDOL 0.5 MG PO TABS
0.5000 mg | ORAL_TABLET | ORAL | Status: DC | PRN
  Filled 2018-08-06: qty 1

## 2018-08-06 MED ORDER — LORAZEPAM 2 MG/ML PO CONC: 1.0000 mg | ORAL | Status: DC | PRN

## 2018-08-06 MED ORDER — HALOPERIDOL LACTATE 5 MG/ML IJ SOLN
0.5000 mg | INTRAMUSCULAR | Status: DC | PRN
  Administered 2018-08-06: 0.5 mg via INTRAVENOUS
  Filled 2018-08-06: qty 1

## 2018-08-06 MED ORDER — GLYCOPYRROLATE 1 MG PO TABS
1.0000 mg | ORAL_TABLET | ORAL | Status: DC | PRN
  Filled 2018-08-06: qty 1

## 2018-08-06 NOTE — Progress Notes (Signed)
  Date: 08/06/2018  Patient name: Bryan Baxter  Medical record number: [id number]  Date of birth: [birth year]   I have seen and evaluated this patient and I have discussed the plan of care with the house staff. Please see Dr. Redgie Grayer note for complete details. I concur with his findings and plan.  Transition to comfort care.   Sid Falcon, MD 08/06/2018, 8:19 PM

## 2018-08-06 NOTE — Care Management Important Message (Signed)
Important Message  Patient Details  Name: Bryan Baxter MRN: [id number] Date of Birth: December 18, 1945   Medicare Important Message Given:  Yes     [name] Montine Circle 08/06/2018, 1:40 PM

## 2018-08-06 NOTE — Consult Note (Signed)
Consultation Note Date: 08/06/2018   Patient Name: Bryan Baxter  DOB: 1945/08/20  MRN: [id number]  Age / Sex: 73 y.o., male  PCP: Francesca Oman, DO Referring Physician: Sid Falcon, MD  Reason for Consultation: Establishing goals of care  HPI/Patient Profile: 73 y.o. male  with past medical history of small cell lung cancer and multiple falls admitted on 08/03/2018 with AMS, concern of seizure. Patient was admitted from SNF.  Patient had been receiving cancer treatment at [facility]. He was found to have diffuse lytic lesions, hypercalcemia, and hypernatremia during work-up. Electrolyte abnormalities were corrected and patient was given IV fluids; however, he has remained non-verbal and minimally interactive - unable to eat/drink d/t AMS. PMT consulted for [place].  Clinical Assessment and Goals of Care: I have reviewed medical records including EPIC notes, labs and imaging, and received report from RN - she tells me patient remains nonverbal, occasional nonverbal signs of pain tachycardic, unable to follow commands, NPO, mostly sleeping.   I then spoke with patient's daughter, Carlyon Shadow,  to discuss diagnosis prognosis, [place], EOL wishes, disposition and options.  I introduced Palliative Medicine as specialized medical care for people living with serious illness. It focuses on providing relief from the symptoms and stress of a serious illness. The goal is to improve quality of life for both the patient and the family.   As far as functional and nutritional status, Carlyon Shadow tells me of patient's steady decline since he has received cancer treatment. She tells me of weight loss and poor appetite. She tells me he has been living at [facility] for about 1.5 months and since that time he was been mostly in a wheelchair - unable to walk without significant assistance. She tells me patient was very independent prior to admission to SNF.    We discussed his  current illness and what it means in the larger context of his on-going co-morbidities.  Natural disease trajectory and expectations at EOL were discussed. Daughter has good understanding and reports that it appears to her he is nearing end of life. She tells me she was able to visit Sunday - tells me it was shocking to see his condition.   I attempted to elicit values and goals of care important to the patient.  She tells me patient would want to focus on comfort - would not want life artificially prolonged.   The difference between aggressive medical intervention and comfort care was considered in light of the patient's goals of care. Darlene would like to transition to full comfort care.   Hospice and Palliative Care services outpatient were explained and offered. We discussed hospice facility as an option for patient - Carlyon Shadow agrees to this and would like to try to get him to hospice home in [facility]. I have consulted social work.   Questions and concerns were addressed. The family was encouraged to call with questions or concerns.   Primary Decision Maker NEXT OF KIN - daughter Celesta Gentile    SUMMARY OF RECOMMENDATIONS   - full comfort care, d/c all labs, IV fluids, imaging - transfer to hospice facility - family hoping for [employer] - social work has been consulted  Code Status/Advance Care Planning:  DNR   Symptom Management:   PRN morphine  PRN ativan  PRN haldol  PRN robinul  Palliative Prophylaxis:   Aspiration, Delirium Protocol, Frequent Pain Assessment, Oral Care and Turn Reposition  Additional Recommendations (Limitations, Scope, Preferences):  Full Comfort Care  Psycho-social/Spiritual:  Desire for further Chaplaincy support:no  Additional Recommendations: Education on Hospice  Prognosis:   < 2 weeks  Discharge Planning: Hospice facility      Primary Diagnoses: Present on Admission: . Hypercalcemia . Acute encephalopathy .  Hypernatremia . Hypokalemia . Protein-calorie malnutrition ([place])   I have reviewed the medical record, interviewed the patient and family, and examined the patient. The following aspects are pertinent.  Past Medical History:  Diagnosis Date  . Neoplasm of lung   . 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HCC)    Social History   Socioeconomic History  . Marital status: Divorced    Spouse name: Not on file  . Number of children: Not on file  . Years of education: Not on file  . Highest education level: Not on file  Occupational History  . Not on file  Social Needs  . Financial resource strain: Not on file  . Food insecurity    Worry: Not on file    Inability: Not on file  . Transportation needs    Medical: Not on file    Non-medical: Not on file  Tobacco Use  . Smoking status: Not on file  Substance and Sexual Activity  . Alcohol use: Not on file  . Drug use: Not on file  . Sexual activity: Not on file  Lifestyle  . Physical activity    Days per week: Not on file    Minutes per session: Not on file  . Stress: Not on file  Relationships  . Social Herbalist on phone: Not on file    Gets together: Not on file    Attends religious service: Not on file    Active member of club or organization: Not on file    Attends meetings of clubs or organizations: Not on file    Relationship status: Not on file  Other Topics Concern  . Not on file  Social History Narrative  . Not on file   No family history on file. Scheduled Meds: . mouth rinse  15 mL Mouth Rinse q12n4p  . sodium chloride flush  3 mL Intravenous Q12H   Continuous Infusions: PRN Meds:.acetaminophen **OR** acetaminophen, glycopyrrolate **OR** glycopyrrolate **OR** glycopyrrolate, haloperidol **OR** haloperidol **OR** haloperidol lactate, LORazepam **OR** LORazepam **OR** LORazepam, morphine injection No Known Allergies  Vital Signs: BP (!) 143/87 (BP Location: Left Arm)   Pulse (!) 105   Temp 97.8 F (36.6 C)  (Oral)   Resp 18   Wt 47.2 kg   SpO2 99%  Pain Scale: PAINAD POSS *See Group Information*: 1-Acceptable,Awake and alert Pain Score: Asleep   SpO2: SpO2: 99 % O2 Device:SpO2: 99 % O2 Flow Rate: .   IO: Intake/output summary:   Intake/Output Summary (Last 24 hours) at 08/06/2018 1354 Last data filed at 08/06/2018 0258 Gross per 24 hour  Intake 1293.08 ml  Output 750 ml  Net 543.08 ml    LBM: Last BM Date: 08/05/18 Baseline Weight: Weight: 44.4 kg Most recent weight: Weight: 47.2 kg     Palliative Assessment/Data: PPS 10%    The above conversation was completed via telephone due to the visitor restrictions during the COVID-19 pandemic. Thorough chart review and discussion with necessary members of the care team was completed as part of assessment. All issues were discussed and addressed but no physical exam was performed.   Time Total: 70 minutes Greater than 50%  of this time was spent counseling and coordinating care related to the above assessment and plan.  Darol Destine  Charyl Bigger, DNP, AGNP-C Palliative Medicine Team [phone] Pager: [phone]

## 2018-08-06 NOTE — Progress Notes (Signed)
   Subjective: Saw patient in the afternoon.  Was getting an MRI during morning rounds.  Appeared comfortable resting in bed.  Alert but disoriented.  Objective:  Vital signs in last 24 hours: Vitals:   08/06/18 0640 08/06/18 0641 08/06/18 0648 08/06/18 1155  BP:  (!) 143/87    Pulse: (!) 134  (!) 105   Resp: (!) 33 (!) 28 18   Temp:  97.8 F (36.6 C)  97.8 F (36.6 C)  TempSrc:  Axillary  Oral  SpO2: 100%  99%   Weight:  47.2 kg     Physical Exam Constitutional:      Appearance: He is ill-appearing and toxic-appearing.  HENT:     Head: Normocephalic and atraumatic.     Comments: Temporal wasting    Mouth/Throat:     Mouth: Mucous membranes are dry.  Eyes:     Pupils: Pupils are equal, round, and reactive to light.  Cardiovascular:     Rate and Rhythm: Tachycardia present.     Pulses: Normal pulses.     Heart sounds: Normal heart sounds. No murmur. No friction rub. No gallop.   Pulmonary:     Effort: Pulmonary effort is normal. No respiratory distress.     Breath sounds: Normal breath sounds. No wheezing or rales.  Abdominal:     General: Abdomen is flat. There is no distension.     Tenderness: There is no abdominal tenderness. There is no guarding.  Musculoskeletal:     Comments: Diffusely cachectic  Skin:    General: Skin is warm.  Neurological:     Mental Status: He is alert. He is disoriented.     Comments: Alert but disoriented.  Intermittently responds to commands correctly but inconsistent  Psychiatric:     Comments: Unable to assess    Assessment/Plan:  Principal Problem:   Hypercalcemia Active Problems:   Acute encephalopathy   Hypernatremia   Hypokalemia   Protein-calorie malnutrition (HCC)   Dehydration   Goals of care, counseling/discussion   Palliative care by specialist   Comfort measures only status  #Altered Mental Status #Hypernatremia #Hypokalemia #Hypercalcemia #Small Cell Lung Cancer #FEN/GI:Reportedly had a possible seizure at  [facility] place, however we are unable to confirm this. He was described to have a deviated gaze, was unresponsive and having generalized shaking. He had no seizure-like activity in the ED, however he was agitated and resisting staff. He received 2mg Ativan for agitation. On our exam he was unresponsive, but withdrew to painful stimuli in all 4 extremities. Despite our inability to confirm seizure-like activity, it is highly suspicious given severe electrolyte imbalances.It certainly plausible that he hada seizure because of this. He is also hypovolemic as evidenced by dry appearance on physical exam and absent urine output on cath attempt per nursing. At this poin we have improved his electrolyte abnormalitieswithIV fluids.He was alert, but disoriented today.  Family has been involved with palliative care and have decided to do comfort care with transfer to hospice.  All of our efforts at this point will be focused on comfort.  Dispo: Pending discharge to Hospice facility.   Earlene Plater, MD Internal Medicine, PGY1 Pager: [phone]  08/06/2018,2:39 PM

## 2018-08-06 NOTE — Progress Notes (Signed)
Patient was laying tense in bed, moaning occasionally. This RN gave Tylenol Supp. Per PRN orders. Patient had BM, so this RN attempted to clean patient and change bed pad. Patient became agitated and combative. Some of patient's speech may have been comprehensible, like "trying to", but mostly incomprehensible. Patient repeatedly grabbing this RN's hands and pulling condom cath. Patient not redirectable. Rosanne Ashing, NT assisted this RN. Safety mitts applied. Patient was grabbing onto mitts and making care difficult. Will pass on to day shift RN, and will continue to monitor.

## 2018-08-07 MED ORDER — HALOPERIDOL LACTATE 5 MG/ML IJ SOLN: 0.5000 mg | INTRAMUSCULAR | Status: AC | PRN

## 2018-08-07 MED ORDER — HALOPERIDOL 0.5 MG PO TABS: 0.5000 mg | ORAL_TABLET | ORAL | Status: AC | PRN

## 2018-08-07 MED ORDER — LORAZEPAM 2 MG/ML PO CONC: 1.0000 mg | ORAL | 0 refills | Status: AC | PRN

## 2018-08-07 MED ORDER — GLYCOPYRROLATE 1 MG PO TABS: 1.0000 mg | ORAL_TABLET | ORAL | Status: AC | PRN

## 2018-08-07 MED ORDER — GLYCOPYRROLATE 0.2 MG/ML IJ SOLN: 0.2000 mg | INTRAMUSCULAR | Status: AC | PRN

## 2018-08-07 MED ORDER — HALOPERIDOL LACTATE 2 MG/ML PO CONC: 0.6000 mg | ORAL | 0 refills | Status: AC | PRN

## 2018-08-07 MED ORDER — MORPHINE SULFATE (PF) 2 MG/ML IV SOLN: 2.0000 mg | INTRAVENOUS | 0 refills | Status: AC | PRN

## 2018-08-07 MED ORDER — LORAZEPAM 1 MG PO TABS: 1.0000 mg | ORAL_TABLET | ORAL | 0 refills | Status: AC | PRN

## 2018-08-07 MED ORDER — LORAZEPAM 2 MG/ML IJ SOLN: 1.0000 mg | INTRAMUSCULAR | 0 refills | Status: AC | PRN

## 2018-08-07 NOTE — TOC Transition Note (Signed)
Transition of Care [facility]) - CM/SW Discharge Note   Patient Details  Name: Bryan Baxter MRN: [id number] Date of Birth: October 04, 1945  [facility]) CM/SW Contact:  Sharin Mons, RN Phone Number: 08/07/2018, 10:44 AM   Clinical Narrative:    [facility] [place] Anticipated DC date: 08/07/2018 Family notified: Carlyon Shadow ( daughter) Transport by: Corey Harold   Per MD patient ready for [facility].  RN to call report prior to discharge [phone]). DC packet on chart. Ambulance transport requested for patient.  RNCM will sign off for now as intervention is no longer needed. Please consult [place] again if new needs arise.  Final next level of care: [place] Barriers to Discharge: No Barriers Identified   Patient Goals and CMS Choice        Discharge Placement                       Discharge Plan and Services                                     Social Determinants of Health (SDOH) Interventions     Readmission Risk Interventions No flowsheet data found.

## 2018-08-07 NOTE — Discharge Summary (Signed)
Name: Bryan Baxter MRN: [id number] DOB: 08/08/1945 73 y.o. PCP: Francesca Oman, DO  Date of Admission: 08/03/2018  8:43 AM Date of Discharge: 08/07/2018 Attending Physician: Sid Falcon, MD  Discharge Diagnosis:  Hypercalcemia Hypernatremia Hypokalemia Encephalopathy Metastatic lung cancer  Discharge Medications: Allergies as of 08/07/2018   No Known Allergies     Medication List    STOP taking these medications   albuterol 108 (90 Base) MCG/ACT inhaler Commonly known as: VENTOLIN HFA   aluminum-magnesium hydroxide 200-200 MG/5ML suspension   amitriptyline 10 MG tablet Commonly known as: ELAVIL   aspirin EC 81 MG tablet   atorvastatin 40 MG tablet Commonly known as: LIPITOR   benzonatate 200 MG capsule Commonly known as: TESSALON   bisacodyl 5 MG EC tablet Commonly known as: DULCOLAX   guaiFENesin 600 MG 12 hr tablet Commonly known as: MUCINEX   megestrol 400 MG/10ML suspension Commonly known as: MEGACE   metoprolol succinate 50 MG 24 hr tablet Commonly known as: TOPROL-XL   mirtazapine 15 MG tablet Commonly known as: REMERON   oxybutynin 5 MG 24 hr tablet Commonly known as: DITROPAN-XL   tamsulosin 0.4 MG Caps capsule Commonly known as: FLOMAX     TAKE these medications   glycopyrrolate 1 MG tablet Commonly known as: ROBINUL Take 1 tablet (1 mg total) by mouth every 4 (four) hours as needed (excessive secretions).   glycopyrrolate 0.2 MG/ML injection Commonly known as: ROBINUL Inject 1 mL (0.2 mg total) into the skin every 4 (four) hours as needed (excessive secretions).   glycopyrrolate 0.2 MG/ML injection Commonly known as: ROBINUL Inject 1 mL (0.2 mg total) into the vein every 4 (four) hours as needed (excessive secretions).   haloperidol 0.5 MG tablet Commonly known as: HALDOL Take 1 tablet (0.5 mg total) by mouth every 4 (four) hours as needed for agitation (or delirium).   haloperidol 2 MG/ML solution Commonly known as:  HALDOL Place 0.3 mLs (0.6 mg total) under the tongue every 4 (four) hours as needed for agitation (or delirium).   haloperidol lactate 5 MG/ML injection Commonly known as: HALDOL Inject 0.1 mLs (0.5 mg total) into the vein every 4 (four) hours as needed (or delirium).   LORazepam 1 MG tablet Commonly known as: ATIVAN Take 1 tablet (1 mg total) by mouth every 4 (four) hours as needed for anxiety.   LORazepam 2 MG/ML concentrated solution Commonly known as: ATIVAN Place 0.5 mLs (1 mg total) under the tongue every 4 (four) hours as needed for anxiety.   LORazepam 2 MG/ML injection Commonly known as: ATIVAN Inject 0.5 mLs (1 mg total) into the vein every 4 (four) hours as needed for anxiety.   morphine 2 MG/ML injection Inject 1 mL (2 mg total) into the vein every hour as needed (or dyspnea).       Disposition and follow-up:   Bryan Baxter was discharged from [facility] to hospice services.  Hypercalcemia Hypernatremia Hypokalemia Encephalopathy Metastatic lung cancer - Electrolytes corrected, but encephalopathy persistent - Known metastatic lung cancer, Brain mets on MRI - Palliative Medicine and family agree on comfort care at hopsice  2.  Labs / imaging needed at time of follow-up: None  3.  Pending labs/ test needing follow-up: None  Follow-up Appointments:   Hospital Course by problem list: Hypercalcemia Hypernatremia Hypokalemia Encephalopathy Metastatic lung cancer Patient presented from facility with acute encephalopathy and possible seizure-like activity. He was noted to have corrected calcium >15, hypo kalemia, and  hypernatremia. He was treated with IVF, Bisphosphonate, calcitonin to correct calcium. Magnesium and Potassium were repleted. EEG was performed and was without seizure activity. MRI showed 8 mm enhancing lesion right frontal cortex consistent with metastatic disease, Possible enhancing lesion right  cerebellum, and other area of restricted diffusion representing further metastatic disease vs infarct. Patient was only minimally interactive during admission, at one point would squeeze fingers on command, but this was transient. He remained non-verbal and appeared to be in pain due to grimmacing. He was provided pain control, and palliative medicine medicine was consulted. Based on his ongoing decline prior to admission from metastatic lung cancer and further rapid decline just prior to and during admission, it was agreed upon with his family that patient is appropriate for comfort care and is being discharge to hospice. Further care per hospice providers.  Discharge Vitals:   BP (!) 149/75 (BP Location: Left Arm)   Pulse (!) 130   Temp 98.2 F (36.8 C) (Axillary)   Resp 19   Wt 47.2 kg   SpO2 98%   Pertinent Labs, Studies, and Procedures:  CBC Latest Ref Rng & Units 08/05/2018 08/04/2018 08/03/2018  WBC 4.0 - 10.5 K/uL 9.6 8.6 6.7  Hemoglobin 13.0 - 17.0 g/dL 8.0(L) 8.6(L) 8.8(L)  Hematocrit 39.0 - 52.0 % 27.3(L) 29.2(L) 32.1(L)  Platelets 150 - 400 K/uL 137(L) 153 156   CMP Latest Ref Rng & Units 08/06/2018 08/06/2018 08/05/2018  Glucose 70 - 99 mg/dL 124(H) 123(H) 117(H)  BUN 8 - 23 mg/dL 8 8 9   Creatinine 0.61 - 1.24 mg/dL 0.65 0.65 0.75  Sodium 135 - 145 mmol/L 143 144 146(H)  Potassium 3.5 - 5.1 mmol/L 3.0(L) 3.1(L) 3.4(L)  Chloride 98 - 111 mmol/L 114(H) 113(H) 115(H)  CO2 22 - 32 mmol/L 21(L) 23 22  Calcium 8.9 - 10.3 mg/dL 8.6(L) 8.8(L) 9.1  Total Protein 6.5 - 8.1 g/dL - - -  Total Bilirubin 0.3 - 1.2 mg/dL - - -  Alkaline Phos 38 - 126 U/L - - -  AST 15 - 41 U/L - - -  ALT 0 - 44 U/L - - -   CXR: IMPRESSION: Mild scarring right mid lung. No edema or consolidation. Stable cardiac silhouette. Stable central catheter positioning.  CT Head and C-Spine: IMPRESSION: 1. No acute intracranial abnormality. 2. Moderate brain parenchymal atrophy, chronic microvascular disease. 3.  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 4. Lytic lesions within the skull overlying the left frontal lobe, and left cerebellum, concerning for osseous metastatic disease or primary bone malignancy. 5. No evidence of acute traumatic injury to the cervical spine. 6. Lytic lesions within C4, C2 and C1 vertebral bodies, concerning for osseous metastatic disease. No evidence of pathologic fractures. 7. Multilevel osteoarthritic changes of the cervical spine. 8. Nodular pleural/subpleural thickening in the right apex, which may represent pleural-parenchymal scarring, however a pulmonary nodule cannot be excluded. 9. Calcific atherosclerotic disease of the carotid arteries.  MR Brain: IMPRESSION: 8 mm enhancing lesion right frontal cortex consistent with metastatic disease. Possible enhancing lesion right cerebellum  Nonenhancing areas of restricted diffusion in the right occipital lobe and left parietal lobe. These areas may be due to acute or subacute infarct versus metastatic disease.  Atrophy and moderate chronic microvascular ischemia  Image quality degraded by significant motion.   Discharge Instructions: Discharge Instructions    Discharge instructions   Complete by: As directed    Thank you for allowing [place] to care for you  You care will be  continue at hospice.     Signed: Neva Seat, MD 08/07/2018, 10:43 AM   Pager: [phone]

## 2018-08-07 NOTE — Progress Notes (Signed)
Qamar Aughenbaugh to be D/C'd to [facility] per MD order.  Discussed with the patient and all questions fully answered.  VSS, Skin clean, dry and intact without evidence of skin break down, no evidence of skin tears noted. IV catheter discontinued intact. Site without signs and symptoms of complications. Dressing and pressure applied.  An After Visit Summary was printed and given to the patient. Patient received prescription.  D/c education completed with patient/family including follow up instructions, medication list, d/c activities limitations if indicated, with other d/c instructions as indicated by MD - patient able to verbalize understanding, all questions fully answered.   Patient instructed to return to ED, call 911, or call MD for any changes in condition.   Patient escorted via [facility] to [facility]. Report given to Lyndon 08/07/2018 2:52 PM

## 2018-08-07 NOTE — Plan of Care (Signed)
  Problem: Education: Goal: Knowledge of General Education information will improve Description: Including pain rating scale, medication(s)/side effects and non-pharmacologic comfort measures Outcome: Adequate for Discharge   Problem: Health Behavior/Discharge Planning: Goal: Ability to manage health-related needs will improve Outcome: Adequate for Discharge   Problem: Clinical Measurements: Goal: Ability to maintain clinical measurements within normal limits will improve Outcome: Adequate for Discharge Goal: Will remain free from infection Outcome: Adequate for Discharge Goal: Diagnostic test results will improve Outcome: Adequate for Discharge Goal: Respiratory complications will improve Outcome: Adequate for Discharge Goal: Cardiovascular complication will be avoided Outcome: Adequate for Discharge   Problem: Activity: Goal: Risk for activity intolerance will decrease Outcome: Adequate for Discharge   Problem: Nutrition: Goal: Adequate nutrition will be maintained Outcome: Adequate for Discharge   Problem: Coping: Goal: Level of anxiety will decrease Outcome: Adequate for Discharge   Problem: Elimination: Goal: Will not experience complications related to bowel motility Outcome: Adequate for Discharge Goal: Will not experience complications related to urinary retention Outcome: Adequate for Discharge   Problem: Pain Managment: Goal: General experience of comfort will improve Outcome: Adequate for Discharge   Problem: Safety: Goal: Ability to remain free from injury will improve Outcome: Adequate for Discharge   Problem: Skin Integrity: Goal: Risk for impaired skin integrity will decrease Outcome: Adequate for Discharge   Problem: Education: Goal: Knowledge of the prescribed therapeutic regimen will improve Outcome: Adequate for Discharge   Problem: Coping: Goal: Ability to identify and develop effective coping behavior will improve Outcome: Adequate for  Discharge   Problem: Clinical Measurements: Goal: Quality of life will improve Outcome: Adequate for Discharge   Problem: Respiratory: Goal: Verbalizations of increased ease of respirations will increase Outcome: Adequate for Discharge   Problem: Role Relationship: Goal: Family's ability to cope with current situation will improve Outcome: Adequate for Discharge Goal: Ability to verbalize concerns, feelings, and thoughts to partner or family member will improve Outcome: Adequate for Discharge   Problem: Pain Management: Goal: Satisfaction with pain management regimen will improve Outcome: Adequate for Discharge

## 2018-08-07 NOTE — Progress Notes (Signed)
  Date: 08/07/2018  Patient name: Bryan Baxter  Medical record number: [id number]  Date of birth: May 26, 1945   I have seen and evaluated this patient and I have discussed the plan of care with the house staff. Please see Dr. Redgie Grayer note for complete details. I concur with his findings and plan.  Discharge to [facility] hospice today.     Sid Falcon, MD 08/07/2018, 6:13 PM

## 2018-08-07 NOTE — Progress Notes (Signed)
   Subjective: Patient seen on rounds at the bedside this morning.  Unresponsive to voices and commands.  Appears that he is resting comfortably.  Objective:  Vital signs in last 24 hours: Vitals:   08/06/18 0648 08/06/18 1155 08/06/18 1156 08/06/18 2119  BP:   103/65 (!) 149/75  Pulse: (!) 105  (!) 114 (!) 130  Resp: 18  19   Temp:  97.8 F (36.6 C)  98.2 F (36.8 C)  TempSrc:  Oral  Axillary  SpO2: 99%  99% 98%  Weight:       Physical Exam Constitutional:      General: He is in acute distress.     Appearance: He is ill-appearing and toxic-appearing.  HENT:     Head: Normocephalic and atraumatic.     Mouth/Throat:     Mouth: Mucous membranes are dry.     Comments: Lips peeling Cardiovascular:     Rate and Rhythm: Tachycardia present.     Pulses: Normal pulses.     Heart sounds: Normal heart sounds. No murmur. No friction rub. No gallop.   Pulmonary:     Effort: Pulmonary effort is normal. No respiratory distress.     Breath sounds: Normal breath sounds. No wheezing or rales.  Abdominal:     General: Abdomen is flat. Bowel sounds are normal. There is no distension.     Palpations: Abdomen is soft.     Tenderness: There is no abdominal tenderness. There is no guarding.  Musculoskeletal:     Comments: Diffusely cachectic  Skin:    General: Skin is warm.  Neurological:     Mental Status: He is disoriented.  Psychiatric:     Comments: Unable to assess    Assessment/Plan:  Principal Problem:   Hypercalcemia Active Problems:   Acute encephalopathy   Hypernatremia   Hypokalemia   Protein-calorie malnutrition (HCC)   Dehydration   Goals of care, counseling/discussion   Palliative care by specialist   Comfort measures only status  #Altered Mental Status #Hypernatremia #Hypokalemia #Hypercalcemia #Small Cell Lung Cancer #FEN/GI:Reportedly had a possible seizure at [facility] place, however we are unable to confirm this. He was described to have a deviated gaze,  was unresponsive and having generalized shaking. He had no seizure-like activity in the ED, however he was agitated and resisting staff. He received 2mg Ativan for agitation. On our exam for admission he was unresponsive, but withdrew to painful stimuli in all 4 extremities. Despite our inability to confirm seizure-like activity, it is highly suspicious given severe electrolyte imbalances.It certainly plausible that he hada seizure because of this. He is also hypovolemic as evidenced by dry appearance on physical exam and absent urine output on cath attempt per nursing. At this poin we have improved his electrolyte abnormalitieswithIV fluids.He was alert, but disoriented today. Family has been involved with palliative care and have decided to do comfort care with transfer to hospice.  All of our efforts at this point will be focused on comfort. -Per social work patient has been accepted to a facility.  DNR forms have been signed  Dispo: Pending discharge to Hospice facility today.  Earlene Plater, MD Internal Medicine, PGY1 Pager: [phone]  08/07/2018,10:58 AM

## 2018-08-07 NOTE — Discharge Instructions (Signed)
Dehydration, Adult  Dehydration is when there is not enough fluid or water in your body. This happens when you lose more fluids than you take in. Dehydration can range from mild to very bad. It should be treated right away to keep it from getting very bad. Symptoms of mild dehydration may include:  Thirst.  Dry lips.  Slightly dry mouth.  Dry, warm skin.  Dizziness. Symptoms of moderate dehydration may include:  Very dry mouth.  Muscle cramps.  Dark pee (urine). Pee may be the color of tea.  Your body making less pee.  Your eyes making fewer tears.  Heartbeat that is uneven or faster than normal (palpitations).  Headache.  Light-headedness, especially when you stand up from sitting.  Fainting (syncope). Symptoms of very bad dehydration may include:  Changes in skin, such as: ? Cold and clammy skin. ? Blotchy (mottled) or pale skin. ? Skin that does not quickly return to normal after being lightly pinched and let go (poor skin turgor).  Changes in body fluids, such as: ? Feeling very thirsty. ? Your eyes making fewer tears. ? Not sweating when body temperature is high, such as in hot weather. ? Your body making very little pee.  Changes in vital signs, such as: ? Weak pulse. ? Pulse that is more than 100 beats a minute when you are sitting still. ? Fast breathing. ? Low blood pressure.  Other changes, such as: ? Sunken eyes. ? Cold hands and feet. ? Confusion. ? Lack of energy (lethargy). ? Trouble waking up from sleep. ? Short-term weight loss. ? Unconsciousness. Follow these instructions at home:   If told by your doctor, drink an ORS: ? Make an ORS by using instructions on the package. ? Start by drinking small amounts, about  cup (120 mL) every 5-10 minutes. ? Slowly drink more until you have had the amount that your doctor said to have.  Drink enough clear fluid to keep your pee clear or pale yellow. If you were told to drink an ORS, finish the  ORS first, then start slowly drinking clear fluids. Drink fluids such as: ? Water. Do not drink only water by itself. Doing that can make the salt (sodium) level in your body get too low (hyponatremia). ? Ice chips. ? Fruit juice that you have added water to (diluted). ? Low-calorie sports drinks.  Avoid: ? Alcohol. ? Drinks that have a lot of sugar. These include high-calorie sports drinks, fruit juice that does not have water added, and soda. ? Caffeine. ? Foods that are greasy or have a lot of fat or sugar.  Take over-the-counter and prescription medicines only as told by your doctor.  Do not take salt tablets. Doing that can make the salt level in your body get too high (hypernatremia).  Eat foods that have minerals (electrolytes). Examples include bananas, oranges, potatoes, tomatoes, and spinach.  Keep all follow-up visits as told by your doctor. This is important. Contact a doctor if:  You have belly (abdominal) pain that: ? Gets worse. ? Stays in one area (localizes).  You have a rash.  You have a stiff neck.  You get angry or annoyed more easily than normal (irritability).  You are more sleepy than normal.  You have a harder time waking up than normal.  You feel: ? Weak. ? Dizzy. ? Very thirsty.  You have peed (urinated) only a small amount of very dark pee during 6-8 hours. Get help right away if:  You have  symptoms of very bad dehydration.  You cannot drink fluids without throwing up (vomiting).  Your symptoms get worse with treatment.  You have a fever.  You have a very bad headache.  You are throwing up or having watery poop (diarrhea) and it: ? Gets worse. ? Does not go away.  You have blood or something green (bile) in your throw-up.  You have blood in your poop (stool). This may cause poop to look black and tarry.  You have not peed in 6-8 hours.  You pass out (faint).  Your heart rate when you are sitting still is more than 100 beats a  minute.  You have trouble breathing. This information is not intended to replace advice given to you by your health care provider. Make sure you discuss any questions you have with your health care provider. Document Released: 10  Delirium Delirium is a state of mental confusion. It comes on quickly and causes significant changes in a person's thinking and behavior. People with delirium usually have trouble paying attention to what is going on or knowing where they are. They may become very withdrawn or very emotional and unable to sit still. They may even see or feel things that are not there (hallucinations). Delirium is a sign of a serious underlying medical condition. What are the causes? Delirium occurs when something suddenly affects the signals that the brain sends out. Brain signals can be affected by anything that puts severe stress on the body and brain and causes brain chemicals to be out of balance. The most common causes of delirium include:  Infections. These may be bacterial, viral, fungal, or protozoal.  Medicines. These include many over-the-counter and prescription medicines.  Recreational drugs.  Substance withdrawal. This occurs with sudden discontinuation of alcohol, certain medicines, or recreational drugs.  Surgery and anesthesia.  Sudden vascular events, such as stroke and brain hemorrhage.  Other brain disorders, such as migraines, tumors, seizures, and physical head trauma.  Metabolic disorders, such as kidney or liver failure.  Low blood oxygen (anoxia). This may occur with lung disease, cardiac arrest, or carbon monoxide poisoning.  Hormone imbalances (endocrinopathies), such as an overactive thyroid (hyperthyroidism) or underactive thyroid (hypothyroidism).  Vitamin deficiencies. What increases the risk? The following factors may make someone more likely to develop this condition.  Being a child.  Being an older person.  Living alone.  Having  vision loss or hearing loss.  Having an existing brain disease, such as dementia.  Having long-lasting (chronic) medical conditions, such as heart disease.  Being hospitalized for long periods of time. What are the signs or symptoms? Delirium starts with a sudden change in a person's thinking or behavior. Symptoms include:  Not being able to stay awake (drowsiness) or pay attention.  Being confused about places, time, and people.  Forgetfulness.  Having extreme energy levels. These may be low or high.  Changes in sleep patterns.  Extreme mood swings, such as sudden anger or anxiety.  Focusing on things or ideas that are not important.  Rambling and senseless talking.  Difficulty speaking, understanding speech, or both.  Hallucinations.  Tremor or unsteady gait. Symptoms come and go (fluctuate) over time, and they are often worse at the end of the day. How is this diagnosed? People with delirium may not realize that they have the condition. Often, a family member or health care provider is the first person to notice the changes. This condition may be diagnosed based on a physical exam, health history,  and tests.  The health care provider will obtain a detailed history. This may include questions about: ? Current symptoms. ? Medical issues. ? Medicines. ? Recreational drug use.  The health care provider will perform a mental status examination by: ? Asking questions to check for confusion. ? Watching for abnormal behavior.  The health care provider may also order lab tests or additional studies to determine the cause of the delirium. How is this treated? Treatment of delirium depends on the cause and severity. Delirium usually goes away within days or weeks of treating the underlying cause. In the meantime, do not leave the person alone because he or she may accidentally cause self-harm. This condition may be treated with supportive care, such as:  Increased light  during the day and decreased light at night.  Low noise level.  Uninterrupted sleep.  A regular daily schedule.  Clocks and calendars to help with orientation.  Familiar objects, including the person's pictures and clothing.  Frequent visits from familiar family and friends.  A healthy diet.  Gentle exercise. In more severe cases of delirium, medicine may be prescribed to help the person keep calm and think more clearly. Follow these instructions at home:  Continue supportive care as told by a health care provider.  Over-the-counter and prescription medicines should be taken only as told by a health care provider.  Ask a health care provider before using herbs or supplements.  Do not use alcohol or recreational drugs.  Keep all follow-up visits as told by a health care provider. This is important. Contact a health care provider if:  Symptoms do not get better or they become worse.  New symptoms of delirium develop.  Caring for the person at home does not seem safe.  Eating, drinking, or communicating stops.  There are side effects of medicines, such as changes in sleep patterns, dizziness, weight gain, restlessness, movement changes, or tremors. Get help right away if:  Serious thoughts occur about self-harm or about hurting others.  There are serious side effects of medicine, such as: ? Swelling of the face, lips, tongue, or throat. ? Fever, confusion, muscle spasms, or seizures. Summary  Delirium is a state of mental confusion. It comes on quickly and causes significant changes in a person's thinking and behavior.  Delirium is a sign of a serious underlying medical condition.  Certain medical conditions or a long hospital stay may increase the risk of developing delirium.  Treatment of delirium involves treating the underlying cause and providing supportive treatments, such as a calm and familiar environment. This information is not intended to replace advice  given to you by your health care provider. Make sure you discuss any questions you have with your health care provider. Document Released: 10/04/2011 Document Revised: 08/30/2017 Document Reviewed: 08/30/2017 Elsevier Patient Education  [place].   Dehydration  Dehydration is when there is not enough fluid or water in your body. This happens when you lose more fluids than you take in. People who are age 56 or older have a higher risk of getting dehydrated. Dehydration can range from mild to very bad. It should be treated right away to keep it from getting very bad. Symptoms of mild dehydration may include:  Thirst.  Dry lips.  Slightly dry mouth.  Dry, warm skin.  Dizziness. Symptoms of moderate dehydration may include:  Very dry mouth.  Muscle cramps.  Dark pee (urine). Pee may be the color of tea.  Your body making less pee.  Your eyes making fewer tears.  Heartbeat that is uneven or faster than normal (palpitations).  Headache.  Light-headedness, especially when you stand up from sitting.  Fainting (syncope). Symptoms of very bad dehydration may include:  Changes in skin, such as: ? Cold and clammy skin. ? Blotchy (mottled) or pale skin. ? Skin that does not quickly return to normal after being lightly pinched and let go (poor skin turgor).  Changes in body fluids, such as: ? Feeling very thirsty. ? Your eyes making fewer tears. ? Not sweating when body temperature is high, such as in hot weather. ? Your body making very little pee.  Changes in vital signs, such as: ? Weak pulse. ? Pulse that is more than 100 beats a minute when you are sitting still. ? Fast breathing. ? Low blood pressure.  Other changes, such as: ? Sunken eyes. ? Cold hands and feet. ? Confusion. ? Lack of energy (lethargy). ? Trouble waking up from sleep. ? Short-term weight loss. ? Unconsciousness. Follow these instructions at home:   If told by your doctor, drink  an ORS: ? Make an ORS by using instructions on the package. ? Start by drinking small amounts, about  cup (120 mL) every 5-10 minutes. ? Slowly drink more until you have had the amount that your doctor said to have.  Drink enough clear fluid to keep your pee clear or pale yellow. If you were told to drink an ORS, finish the ORS first, then start slowly drinking clear fluids. Drink fluids such as: ? Water. Do not drink only water by itself. Doing that can make the salt (sodium) level in your body get too low (hyponatremia). ? Ice chips. ? Fruit juice that you have added water to (diluted). ? Low-calorie sports drinks.  Avoid: ? Alcohol. ? Drinks that have a lot of sugar. These include high-calorie sports drinks, fruit juice that does not have water added, and soda. ? Caffeine. ? Foods that are greasy or have a lot of fat or sugar.  Take over-the-counter and prescription medicines only as told by your doctor.  Do not take salt tablets. Doing that can make the salt level in your body get too high (hypernatremia).  Eat foods that have minerals (electrolytes). Examples include bananas, oranges, potatoes, tomatoes, and spinach.  Keep all follow-up visits as told by your doctor. This is important. Contact a doctor if:  You have belly (abdominal) pain that: ? Gets worse. ? Stays in one area (localizes).  You have a rash.  You have a stiff neck.  You get angry or annoyed more easily than normal (irritability).  You are more sleepy than normal.  You have a harder time waking up than normal.  You feel: ? Weak. ? Dizzy. ? Very thirsty. Get help right away if:  You have symptoms of very bad dehydration.  You cannot drink fluids without throwing up (vomiting).  Your symptoms get worse with treatment.  You have a fever.  You have a very bad headache.  You are throwing up or having watery poop (diarrhea) and it: ? Gets worse. ? Does not go away.  You have diarrhea for  more than 24 hours.  You have blood or something green (bile) in your throw-up.  You have blood in your poop (stool). This may cause poop to look black and tarry.  You have not peed in 6-8 hours.  You have peed (urinated) only a small amount of very dark pee during 6-8 hours.  You pass out (faint).  Your heart rate when you are sitting still is more than 100 beats a minute.  You have trouble breathing. This information is not intended to replace advice given to you by your health care provider. Make sure you discuss any questions you have with your health care provider. Document Released: 12/29/2010 Document Revised: 12/22/2016 Document Reviewed: 03/05/2015 Elsevier Patient Education  2020 [place].   Dehydration  Dehydration is when there is not enough fluid or water in your body. This happens when you lose more fluids than you take in. People who are age 68 or older have a higher risk of getting dehydrated. Dehydration can range from mild to very bad. It should be treated right away to keep it from getting very bad. Symptoms of mild dehydration may include:  Thirst.  Dry lips.  Slightly dry mouth.  Dry, warm skin.  Dizziness. Symptoms of moderate dehydration may include:  Very dry mouth.  Muscle cramps.  Dark pee (urine). Pee may be the color of tea.  Your body making less pee.  Your eyes making fewer tears.  Heartbeat that is uneven or faster than normal (palpitations).  Headache.  Light-headedness, especially when you stand up from sitting.  Fainting (syncope). Symptoms of very bad dehydration may include:  Changes in skin, such as: ? Cold and clammy skin. ? Blotchy (mottled) or pale skin. ? Skin that does not quickly return to normal after being lightly pinched and let go (poor skin turgor).  Changes in body fluids, such as: ? Feeling very thirsty. ? Your eyes making fewer tears. ? Not sweating when body temperature is high, such as in hot  weather. ? Your body making very little pee.  Changes in vital signs, such as: ? Weak pulse. ? Pulse that is more than 100 beats a minute when you are sitting still. ? Fast breathing. ? Low blood pressure.  Other changes, such as: ? Sunken eyes. ? Cold hands and feet. ? Confusion. ? Lack of energy (lethargy). ? Trouble waking up from sleep. ? Short-term weight loss. ? Unconsciousness. Follow these instructions at home:   If told by your doctor, drink an ORS: ? Make an ORS by using instructions on the package. ? Start by drinking small amounts, about  cup (120 mL) every 5-10 minutes. ? Slowly drink more until you have had the amount that your doctor said to have.  Drink enough clear fluid to keep your pee clear or pale yellow. If you were told to drink an ORS, finish the ORS first, then start slowly drinking clear fluids. Drink fluids such as: ? Water. Do not drink only water by itself. Doing that can make the salt (sodium) level in your body get too low (hyponatremia). ? Ice chips. ? Fruit juice that you have added water to (diluted). ? Low-calorie sports drinks.  Avoid: ? Alcohol. ? Drinks that have a lot of sugar. These include high-calorie sports drinks, fruit juice that does not have water added, and soda. ? Caffeine. ? Foods that are greasy or have a lot of fat or sugar.  Take over-the-counter and prescription medicines only as told by your doctor.  Do not take salt tablets. Doing that can make the salt level in your body get too high (hypernatremia).  Eat foods that have minerals (electrolytes). Examples include bananas, oranges, potatoes, tomatoes, and spinach.  Keep all follow-up visits as told by your doctor. This is important. Contact a doctor if:  You have belly (  abdominal) pain that: ? Gets worse. ? Stays in one area (localizes).  You have a rash.  You have a stiff neck.  You get angry or annoyed more easily than normal (irritability).  You are  more sleepy than normal.  You have a harder time waking up than normal.  You feel: ? Weak. ? Dizzy. ? Very thirsty. Get help right away if:  You have symptoms of very bad dehydration.  You cannot drink fluids without throwing up (vomiting).  Your symptoms get worse with treatment.  You have a fever.  You have a very bad headache.  You are throwing up or having watery poop (diarrhea) and it: ? Gets worse. ? Does not go away.  You have diarrhea for more than 24 hours.  You have blood or something green (bile) in your throw-up.  You have blood in your poop (stool). This may cause poop to look black and tarry.  You have not peed in 6-8 hours.  You have peed (urinated) only a small amount of very dark pee during 6-8 hours.  You pass out (faint).  Your heart rate when you are sitting still is more than 100 beats a minute.  You have trouble breathing. This information is not intended to replace advice given to you by your health care provider. Make sure you discuss any questions you have with your health care provider. Document Released: 12/29/2010 Document Revised: 12/22/2016 Document Reviewed: 03/05/2015 Elsevier Patient Education  2020 [place].   Dehydration, Adult  Dehydration is when there is not enough fluid or water in your body. This happens when you lose more fluids than you take in. Dehydration can range from mild to very bad. It should be treated right away to keep it from getting very bad. Symptoms of mild dehydration may include:  Thirst.  Dry lips.  Slightly dry mouth.  Dry, warm skin.  Dizziness. Symptoms of moderate dehydration may include:  Very dry mouth.  Muscle cramps.  Dark pee (urine). Pee may be the color of tea.  Your body making less pee.  Your eyes making fewer tears.  Heartbeat that is uneven or faster than normal (palpitations).  Headache.  Light-headedness, especially when you stand up from sitting.  Fainting  (syncope). Symptoms of very bad dehydration may include:  Changes in skin, such as: ? Cold and clammy skin. ? Blotchy (mottled) or pale skin. ? Skin that does not quickly return to normal after being lightly pinched and let go (poor skin turgor).  Changes in body fluids, such as: ? Feeling very thirsty. ? Your eyes making fewer tears. ? Not sweating when body temperature is high, such as in hot weather. ? Your body making very little pee.  Changes in vital signs, such as: ? Weak pulse. ? Pulse that is more than 100 beats a minute when you are sitting still. ? Fast breathing. ? Low blood pressure.  Other changes, such as: ? Sunken eyes. ? Cold hands and feet. ? Confusion. ? Lack of energy (lethargy). ? Trouble waking up from sleep. ? Short-term weight loss. ? Unconsciousness. Follow these instructions at home:   If told by your doctor, drink an ORS: ? Make an ORS by using instructions on the package. ? Start by drinking small amounts, about  cup (120 mL) every 5-10 minutes. ? Slowly drink more until you have had the amount that your doctor said to have.  Drink enough clear fluid to keep your pee clear or pale yellow. If  you were told to drink an ORS, finish the ORS first, then start slowly drinking clear fluids. Drink fluids such as: ? Water. Do not drink only water by itself. Doing that can make the salt (sodium) level in your body get too low (hyponatremia). ? Ice chips. ? Fruit juice that you have added water to (diluted). ? Low-calorie sports drinks.  Avoid: ? Alcohol. ? Drinks that have a lot of sugar. These include high-calorie sports drinks, fruit juice that does not have water added, and soda. ? Caffeine. ? Foods that are greasy or have a lot of fat or sugar.  Take over-the-counter and prescription medicines only as told by your doctor.  Do not take salt tablets. Doing that can make the salt level in your body get too high (hypernatremia).  Eat foods that  have minerals (electrolytes). Examples include bananas, oranges, potatoes, tomatoes, and spinach.  Keep all follow-up visits as told by your doctor. This is important. Contact a doctor if:  You have belly (abdominal) pain that: ? Gets worse. ? Stays in one area (localizes).  You have a rash.  You have a stiff neck.  You get angry or annoyed more easily than normal (irritability).  You are more sleepy than normal.  You have a harder time waking up than normal.  You feel: ? Weak. ? Dizzy. ? Very thirsty.  You have peed (urinated) only a small amount of very dark pee during 6-8 hours. Get help right away if:  You have symptoms of very bad dehydration.  You cannot drink fluids without throwing up (vomiting).  Your symptoms get worse with treatment.  You have a fever.  You have a very bad headache.  You are throwing up or having watery poop (diarrhea) and it: ? Gets worse. ? Does not go away.  You have blood or something green (bile) in your throw-up.  You have blood in your poop (stool). This may cause poop to look black and tarry.  You have not peed in 6-8 hours.  You pass out (faint).  Your heart rate when you are sitting still is more than 100 beats a minute.  You have trouble breathing. This information is not intended to replace advice given to you by your health care provider. Make sure you discuss any questions you have with your health care provider. Document Released: 11/05/2008 Document Revised: 12/22/2016 Document Reviewed: 03/05/2015 Elsevier Patient Education  2020 [place]. /14/2010 Document Revised: 12/22/2016 Document Reviewed: 03/05/2015 Elsevier Patient Education  2020 [employer].

## 2018-08-07 NOTE — Progress Notes (Signed)
[facility]  Discussed pt care/ hospice services with daughter who is out of town. She is in agreement to Hospice care and will sign paper work by email. We have received that paperwork and we are ready to receive pt and bed has been accepted when MD feels pt is able and ready to be discharged. Webb Silversmith RN [phone]

## 2018-08-15 LAB — PTH-RELATED PEPTIDE: PTH-related peptide: UNDETERMINED pmol/L

## 2018-08-24 DEATH — deceased

## 2020-02-13 IMAGING — MR MRI HEAD WITHOUT AND WITH CONTRAST
14 series · 48 of 48 positions shown · IV contrast (agent unspecified)
Comparison: CT head 08/03/2018

CLINICAL DATA: Encephalopathy. History of small cell lung cancer.
Falls. Rule out metastatic disease.

EXAM:
MRI HEAD WITHOUT AND WITH CONTRAST
TECHNIQUE: Multiplanar, multiecho pulse sequences of the brain and surrounding
structures were obtained without and with intravenous contrast.
CONTRAST:  5 mL Gadovist IV

[Series 5: DWI · axial · 3.0mm · 0.92mm/px · z∈[-12,+126]mm · 10 of 94 slices shown (1 of 4)]
[im 1/94]
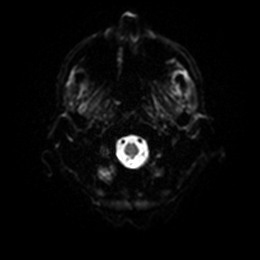
[im 11/94]
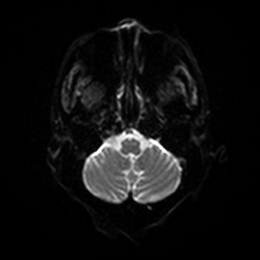
[im 21/94]
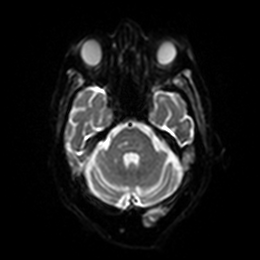
[im 32/94]
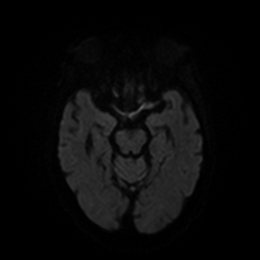
[im 42/94]
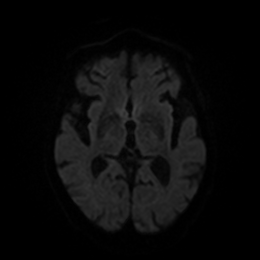
[im 52/94]
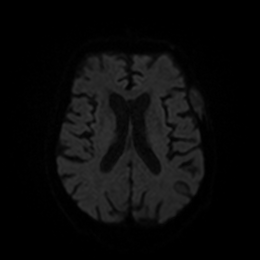
[im 63/94]
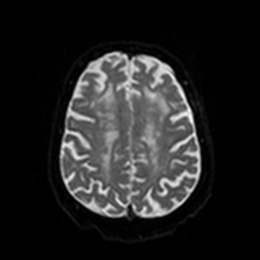
[im 73/94]
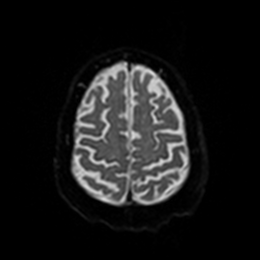
[im 83/94]
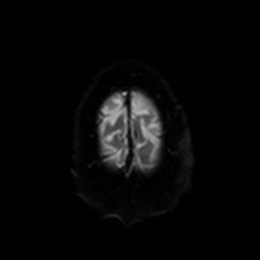
[im 94/94]
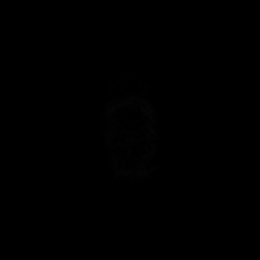

[Series 6: DWI · axial · 3.0mm · 0.92mm/px · z∈[-12,+126]mm · 6 of 47 slices shown (2 of 4)]
[im 1/47]
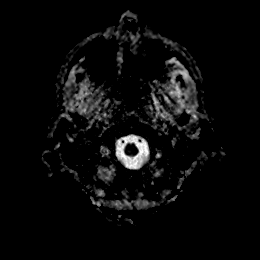
[im 10/47]
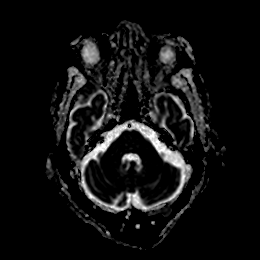
[im 19/47]
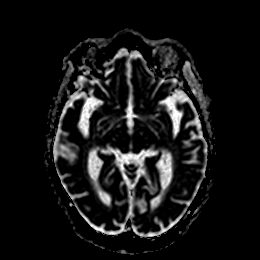
[im 28/47]
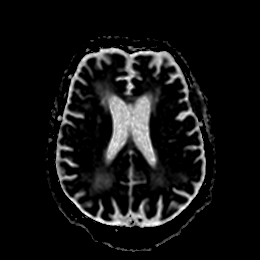
[im 37/47]
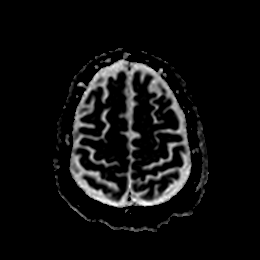
[im 47/47]
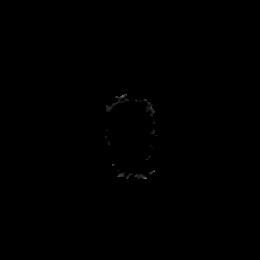

[Series 7: DWI · coronal · 4.0mm · 0.88mm/px · 7 of 64 slices shown (3 of 4)]
[im 1/64]
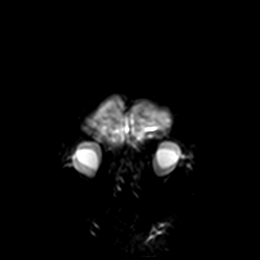
[im 11/64]
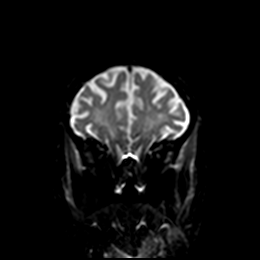
[im 22/64]
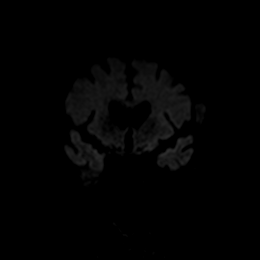
[im 32/64]
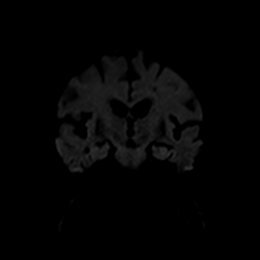
[im 43/64]
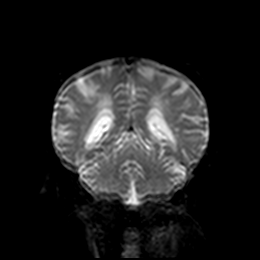
[im 53/64]
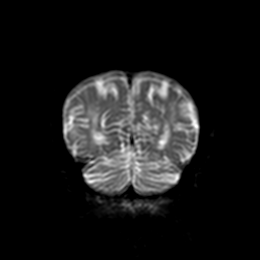
[im 64/64]
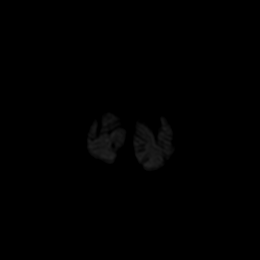

[Series 8: DWI · coronal · 4.0mm · 0.88mm/px · 3 of 32 slices shown (4 of 4)]
[im 1/32]
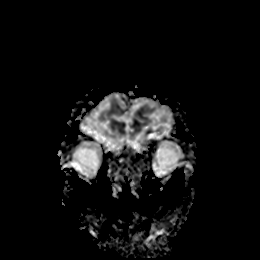
[im 16/32]
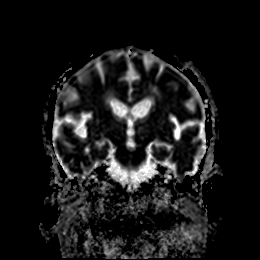
[im 32/32]
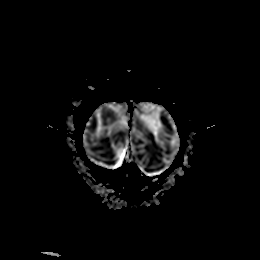

[Series 9: T1 · sagittal · 5.0mm · 0.75mm/px · 2 of 23 slices shown]
[im 1/23]
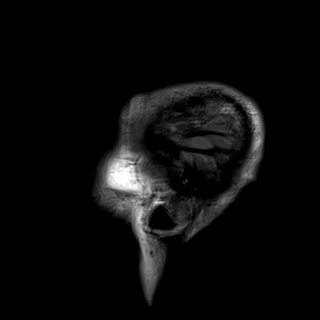
[im 23/23]
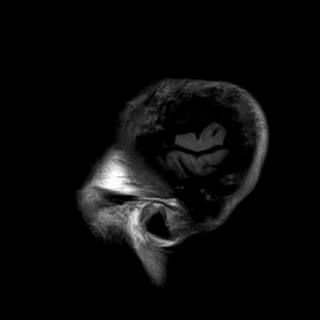

[Series 10: T2 · axial · 5.0mm · 0.72mm/px · z∈[-13,+124]mm · 2 of 24 slices shown (1 of 2)]
[im 1/24]
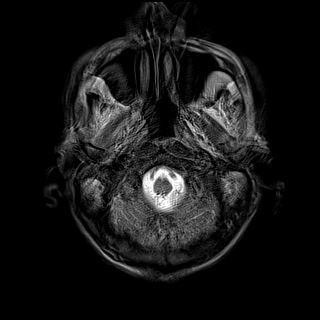
[im 24/24]
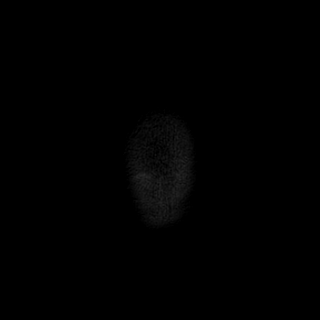

[Series 11: FLAIR · axial · 3.0mm · 0.94mm/px · z∈[-15,+128]mm · 2 of 25 slices shown (1 of 5)]
[im 1/25]
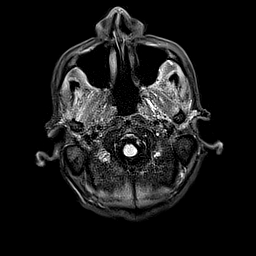
[im 25/25]
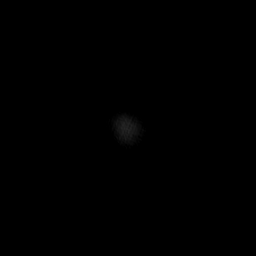

[Series 13: FLAIR · axial · 5.0mm · 0.90mm/px · z∈[-18,+126]mm · 2 of 25 slices shown (2 of 5)]
[im 1/25]
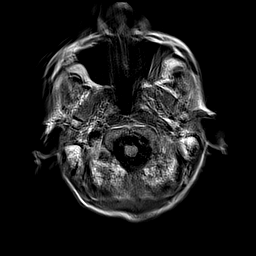
[im 25/25]
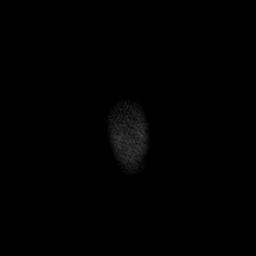

[Series 18: ax hemo · axial · 5.0mm · 0.94mm/px · z∈[-24,+119]mm · 2 of 25 slices shown]
[im 1/25]
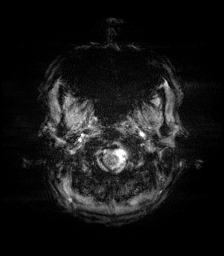
[im 25/25]
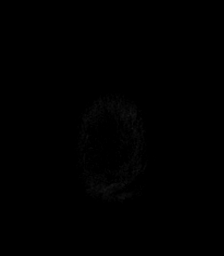

[Series 19: T2 · coronal · 3.0mm · 0.27mm/px · 3 of 32 slices shown (2 of 2)]
[im 1/32]
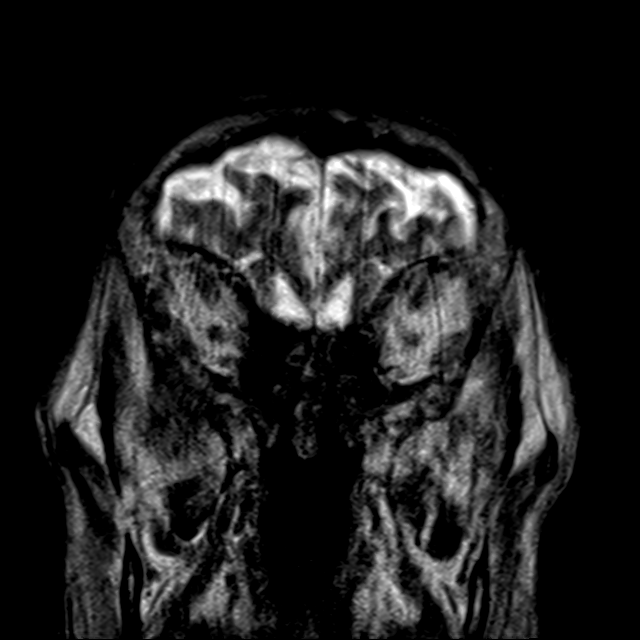
[im 16/32]
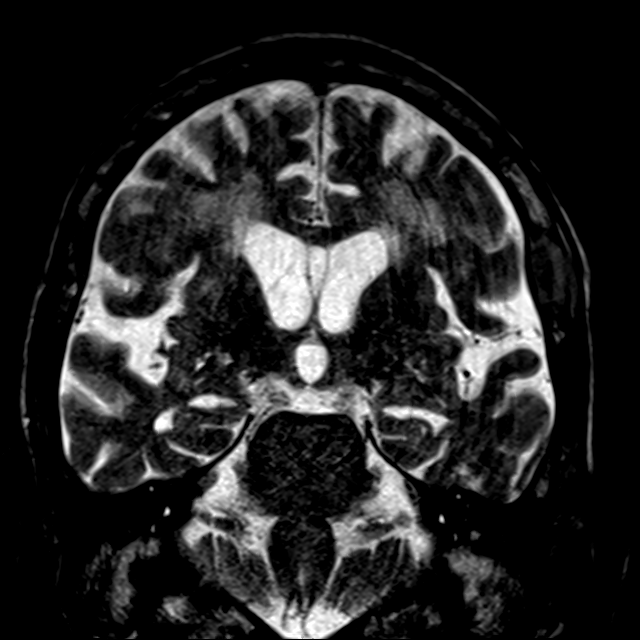
[im 32/32]
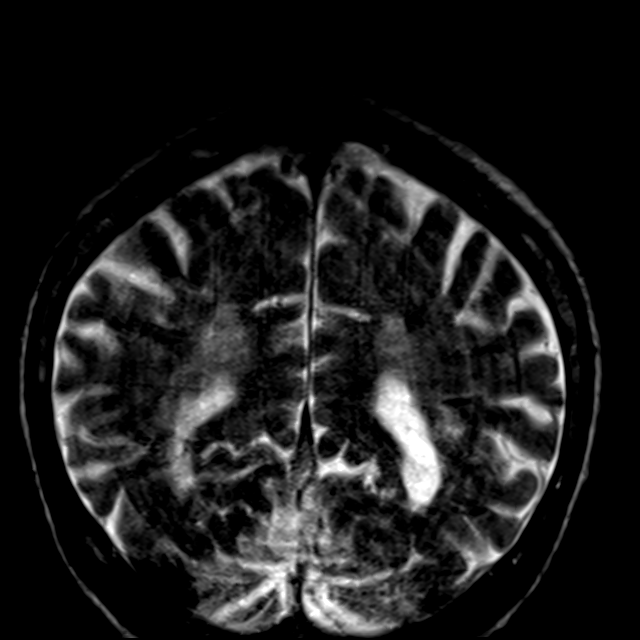

[Series 20: FLAIR · coronal · 3.0mm · 0.56mm/px · 3 of 32 slices shown (3 of 5)]
[im 1/32]
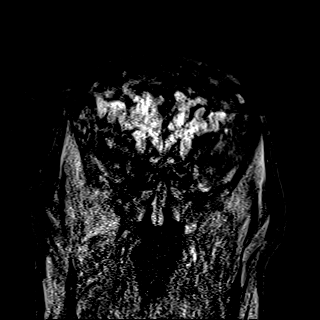
[im 16/32]
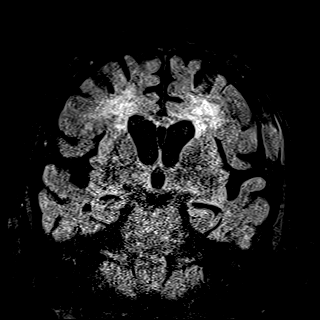
[im 32/32]
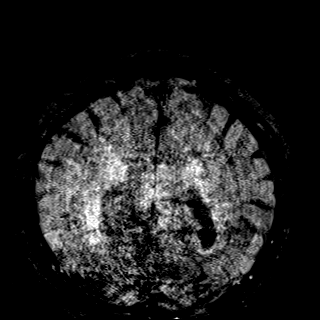

[Series 22: FLAIR · axial · 5.0mm · 0.94mm/px · z∈[-19,+125]mm · 2 of 25 slices shown (4 of 5)]
[im 1/25]
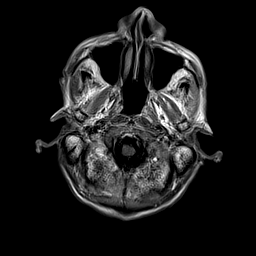
[im 25/25]
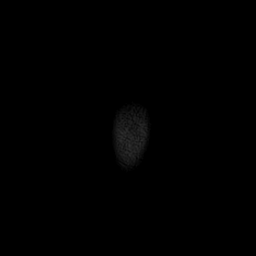

[Series 24: T1 post-contrast · sagittal · 5.0mm · 0.72mm/px · 2 of 23 slices shown]
[im 1/23]
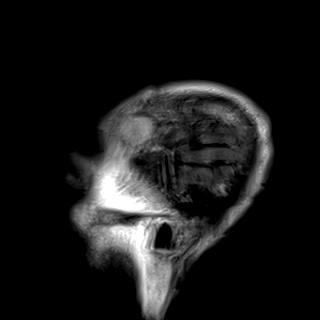
[im 23/23]
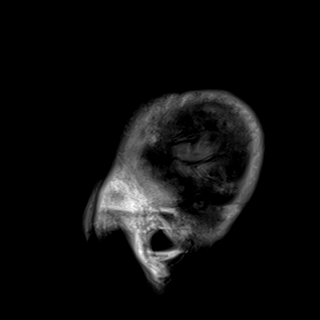

[Series 25: FLAIR · coronal · 5.0mm · 0.94mm/px · 2 of 25 slices shown (5 of 5)]
[im 1/25]
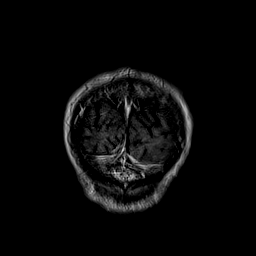
[im 25/25]
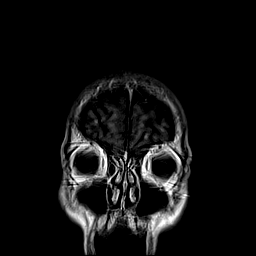

[48 of 48 positions shown; findings below may reference images not displayed]

FINDINGS: Brain: Image quality degraded by significant motion.

8 mm enhancing mass right frontal cortex over the convexity
consistent with metastatic disease.

Patchy enhancement right lateral cerebellum could represent artifact
or metastatic disease.

5 mm focus of restricted diffusion right occipital lobe without
enhancement

5 mm restricted diffusion left parietal lobe without enhancement

Generalized atrophy without hydrocephalus. Moderate white matter
disease bilaterally consistent with microvascular chronic ischemia.
Patchy chronic ischemia basal ganglia and pons. No areas of
hemorrhage.

Vascular: Normal arterial flow voids

Skull and upper cervical spine: Negative

Sinuses/Orbits: Paranasal sinuses clear.  Bilateral cataract surgery

Other: None
IMPRESSION: 8 mm enhancing lesion right frontal cortex consistent with
metastatic disease. Possible enhancing lesion right cerebellum

Nonenhancing areas of restricted diffusion in the right occipital
lobe and left parietal lobe. These areas may be due to acute or
subacute infarct versus metastatic disease.

Atrophy and moderate chronic microvascular ischemia

Image quality degraded by significant motion.
